# Patient Record
Sex: Male | Born: 1955
Health system: Southern US, Community
[De-identification: ages and names within clinical notes are randomized; demographics above are authoritative.]

## PROBLEM LIST (undated history)

## (undated) DIAGNOSIS — Z789 Other specified health status: Secondary | ICD-10-CM

## (undated) DIAGNOSIS — M1990 Unspecified osteoarthritis, unspecified site: Secondary | ICD-10-CM

## (undated) DIAGNOSIS — Z973 Presence of spectacles and contact lenses: Secondary | ICD-10-CM

## (undated) DIAGNOSIS — E785 Hyperlipidemia, unspecified: Secondary | ICD-10-CM

## (undated) HISTORY — PX: KNEE ARTHROSCOPY: SUR90

## (undated) HISTORY — PX: COLONOSCOPY: SHX174

## (undated) HISTORY — PX: SHOULDER ARTHROSCOPY: SHX128

---

## 1999-07-18 ENCOUNTER — Emergency Department (HOSPITAL_COMMUNITY): Admission: EM | Admit: 1999-07-18 | Discharge: 1999-07-18 | Payer: Self-pay | Admitting: Emergency Medicine

## 2009-08-03 ENCOUNTER — Encounter: Admission: RE | Admit: 2009-08-03 | Discharge: 2009-08-03 | Payer: Self-pay | Admitting: Family Medicine

## 2010-11-30 HISTORY — PX: KNEE ARTHROSCOPY: SHX127

## 2013-03-09 NOTE — H&P (Signed)
Michael Campbell, MD   Jacqualine Code, PA-C [address] [place], [place]  [address]                             [phone]   ORTHOPAEDIC HISTORY & PHYSICAL  YATES WEISGERBER MRN:  [id number] DOB/SEX:  [birth year]/male  CHIEF COMPLAINT:  Painful right shoulder  HISTORY: Michael [name] is seen back today for review of the MRI scan of his right shoulder.  The history is that in February he was tossing some lumber up onto a roof, and after repetitive activity he had experienced pain in his right shoulder.  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  He did put the log down after carrying it a distance.  He used anti-inflammatories but really did not have much help.  He had referable pain down to the proximal humerus, more laterally than medially.  He noticed weakness also.  He also was noted back in 2011 that he had a grade III AC separation, seen by Dr. Renae Fickle.   PAST MEDICAL HISTORY: There are no active problems to display for this patient.  Past Medical History  Diagnosis Date  . Arthritis   . Wears glasses   . Medical history non-contributory   . Hyperlipemia    Past Surgical History  Procedure Laterality Date  . Knee arthroscopy  9/12    left  . Colonoscopy       MEDICATIONS:   Prescriptions prior to admission  Medication Sig Dispense Refill  . ibuprofen (ADVIL,MOTRIN) 200 MG tablet Take 200 mg by mouth every 6 (six) hours as needed.      . Multiple Vitamins-Minerals (MULTIVITAMIN WITH MINERALS) tablet Take 1 tablet by mouth daily.      . pravastatin (PRAVACHOL) 40 MG tablet Take 40 mg by mouth every evening.      . traMADol (ULTRAM) 50 MG tablet Take by mouth every 6 (six) hours as needed.        ALLERGIES:  No Known  Allergies  REVIEW OF SYSTEMS:  A comprehensive review of systems was negative.   FAMILY HISTORY:  History reviewed. No pertinent family history.  SOCIAL HISTORY:   History  Substance Use Topics  . Smoking status: Never Smoker   . Smokeless tobacco: Not on file  . Alcohol Use: Yes     Comment: occ      EXAMINATION: Vital signs in last 24 hours: Temp:  [97.6 F (36.4 C)] 97.6 F (36.4 C) (01/06 1146) Pulse Rate:  [67-86] 85 (01/06 1300) Resp:  [12-20] 19 (01/06 1300) BP: (120-132)/(57-81) 120/57 mmHg (01/06 1250) SpO2:  [96 %-100 %] 100 % (01/06 1300) Weight:  [97.251 kg (214 lb 6.4 oz)] 97.251 kg (214 lb 6.4 oz) (01/06 1146)  Head is normocephalic.   Eyes:  Pupils equal, round and reactive to light and accommodation.  Extraocular intact. ENT: Ears, nose, and throat were benign.   Neck: supple, no bruits were noted.   Chest: good expansion.   Lungs: essentially clear.   Cardiac: regular rhythm and rate, normal S1, S2.  No murmurs appreciated. Pulses :  2+ bilateral and symmetric in lower extremities. Abdomen is scaphoid, soft, nontender, no  masses palpable, normal bowel sounds present. CNS:  He is oriented x3 and cranial nerves II-XII grossly intact. Breast, rectal, and genital exams: not performed and not indicated for an orthopedic evaluation. Musculoskeletal: Today he has a shoulder which reveals 160 degrees of abduction and forward flexion.  With the arm at 90 degrees, he has external rotation to about 70 degrees, internal rotation to 45 degrees.  He is weak in abduction as well as external rotation.  Markedly positive empty can test.  Neurovascularly intact distally  Imaging Review MRI scan revealed some motion on the scan.  There is moderate infraspinatus tendinopathy noted within thinning of the distal infraspinatus tendon, likely from a partial-thickness bursal-surface tearing.  This is present to a lesser extent at the supraspinatus tendon.  Subscapularis and teres  minor tendons are intact.  He has intact musculature.  Biceps is intact.  AC joint shows some mild degenerative changes, a small amount of fluid within the [facility] joint.  Anterolaterally a downsloping acromion predisposed to impingement.  Subacromial morphology is type II, and there is subacromial land subdeltoid bursa fluid.  Glenohumeral joint was intact, labrum was intact, and the bones were intact.   ASSESSMENT: Impingement syndrome of the right shoulder.  Partial-thickness tear of infraspinatus and to a lesser extent the supraspinatus of the right shoulder Past Medical History  Diagnosis Date  . Arthritis   . Wears glasses   . Medical history non-contributory   . Hyperlipemia     PLAN: Plan for right shoulder subacromial decompression with distal clavicle excision and possible mini open rotator cuff repair  The procedure,  risks, and benefits of total knee arthroplasty were presented and reviewed. The risks including but not limited to infection, blood clots, vascular and nerve injury, stiffness,  among others were discussed. The patient acknowledged the explanation, agreed to proceed.   [name] 04/05/2013, 1:06 PM

## 2013-03-30 ENCOUNTER — Encounter (HOSPITAL_BASED_OUTPATIENT_CLINIC_OR_DEPARTMENT_OTHER): Payer: Self-pay | Admitting: *Deleted

## 2013-03-30 NOTE — Progress Notes (Signed)
No labs needed-never smoked-healthy-to go ahead and bring meds and overnight bag in case he has to do open repair

## 2013-04-05 ENCOUNTER — Ambulatory Visit (HOSPITAL_BASED_OUTPATIENT_CLINIC_OR_DEPARTMENT_OTHER)
Admission: RE | Admit: 2013-04-05 | Discharge: 2013-04-05 | Disposition: A | Discharge status: Home / Self Care | Payer: 59 | Source: Ambulatory Visit | Attending: Orthopaedic Surgery | Admitting: Orthopaedic Surgery

## 2013-04-05 ENCOUNTER — Ambulatory Visit (HOSPITAL_BASED_OUTPATIENT_CLINIC_OR_DEPARTMENT_OTHER): Payer: 59 | Admitting: Anesthesiology

## 2013-04-05 ENCOUNTER — Encounter (HOSPITAL_BASED_OUTPATIENT_CLINIC_OR_DEPARTMENT_OTHER): Payer: 59 | Admitting: Anesthesiology

## 2013-04-05 ENCOUNTER — Encounter (HOSPITAL_BASED_OUTPATIENT_CLINIC_OR_DEPARTMENT_OTHER)
Admission: RE | Disposition: A | Discharge status: Home / Self Care | Payer: Self-pay | Source: Ambulatory Visit | Attending: Orthopaedic Surgery

## 2013-04-05 ENCOUNTER — Encounter (HOSPITAL_BASED_OUTPATIENT_CLINIC_OR_DEPARTMENT_OTHER): Payer: Self-pay

## 2013-04-05 DIAGNOSIS — E785 Hyperlipidemia, unspecified: Secondary | ICD-10-CM | POA: Insufficient documentation

## 2013-04-05 DIAGNOSIS — S46819A Strain of other muscles, fascia and tendons at shoulder and upper arm level, unspecified arm, initial encounter: Secondary | ICD-10-CM | POA: Insufficient documentation

## 2013-04-05 DIAGNOSIS — M129 Arthropathy, unspecified: Secondary | ICD-10-CM | POA: Insufficient documentation

## 2013-04-05 DIAGNOSIS — M9420 Chondromalacia, unspecified site: Secondary | ICD-10-CM | POA: Insufficient documentation

## 2013-04-05 DIAGNOSIS — S46919A Strain of unspecified muscle, fascia and tendon at shoulder and upper arm level, unspecified arm, initial encounter: Secondary | ICD-10-CM | POA: Insufficient documentation

## 2013-04-05 DIAGNOSIS — M25819 Other specified joint disorders, unspecified shoulder: Secondary | ICD-10-CM | POA: Insufficient documentation

## 2013-04-05 DIAGNOSIS — M7541 Impingement syndrome of right shoulder: Secondary | ICD-10-CM | POA: Diagnosis present

## 2013-04-05 DIAGNOSIS — M19019 Primary osteoarthritis, unspecified shoulder: Secondary | ICD-10-CM | POA: Diagnosis present

## 2013-04-05 DIAGNOSIS — M7580 Other shoulder lesions, unspecified shoulder: Secondary | ICD-10-CM

## 2013-04-05 DIAGNOSIS — X500XXA Overexertion from strenuous movement or load, initial encounter: Secondary | ICD-10-CM | POA: Insufficient documentation

## 2013-04-05 HISTORY — PX: SHOULDER ARTHROSCOPY WITH OPEN ROTATOR CUFF REPAIR AND DISTAL CLAVICLE ACROMINECTOMY: SHX5683

## 2013-04-05 HISTORY — DX: Unspecified osteoarthritis, unspecified site: M19.90

## 2013-04-05 HISTORY — DX: Other specified health status: Z78.9

## 2013-04-05 HISTORY — DX: Hyperlipidemia, unspecified: E78.5

## 2013-04-05 HISTORY — DX: Presence of spectacles and contact lenses: Z97.3

## 2013-04-05 LAB — POCT HEMOGLOBIN-HEMACUE: Hemoglobin: 13.9 g/dL (ref 13.0–17.0)

## 2013-04-05 SURGERY — SHOULDER ARTHROSCOPY WITH OPEN ROTATOR CUFF REPAIR AND DISTAL CLAVICLE ACROMINECTOMY
Anesthesia: General | Site: Shoulder | Laterality: Right

## 2013-04-05 MED ORDER — PROMETHAZINE HCL 25 MG/ML IJ SOLN: 6.2500 mg | INTRAMUSCULAR | Status: DC | PRN

## 2013-04-05 MED ORDER — PROPOFOL 10 MG/ML IV BOLUS
INTRAVENOUS | Status: DC | PRN
  Administered 2013-04-05: 200 mg via INTRAVENOUS

## 2013-04-05 MED ORDER — DEXAMETHASONE SODIUM PHOSPHATE 4 MG/ML IJ SOLN
INTRAMUSCULAR | Status: DC | PRN
  Administered 2013-04-05: 10 mg via INTRAVENOUS

## 2013-04-05 MED ORDER — FENTANYL CITRATE 0.05 MG/ML IJ SOLN
INTRAMUSCULAR | Status: AC
  Filled 2013-04-05: qty 2

## 2013-04-05 MED ORDER — OXYCODONE-ACETAMINOPHEN 5-325 MG PO TABS: 12.0000 | ORAL_TABLET | ORAL | Status: DC | PRN

## 2013-04-05 MED ORDER — BUPIVACAINE-EPINEPHRINE PF 0.5-1:200000 % IJ SOLN
INTRAMUSCULAR | Status: DC | PRN
  Administered 2013-04-05: 30 mL via PERINEURAL

## 2013-04-05 MED ORDER — BUPIVACAINE-EPINEPHRINE PF 0.25-1:200000 % IJ SOLN
INTRAMUSCULAR | Status: AC
  Filled 2013-04-05: qty 30

## 2013-04-05 MED ORDER — CHLORHEXIDINE GLUCONATE 4 % EX LIQD: 60.0000 mL | Freq: Once | CUTANEOUS | Status: DC

## 2013-04-05 MED ORDER — ONDANSETRON HCL 4 MG/2ML IJ SOLN
INTRAMUSCULAR | Status: DC | PRN
  Administered 2013-04-05: 4 mg via INTRAVENOUS

## 2013-04-05 MED ORDER — SUCCINYLCHOLINE CHLORIDE 20 MG/ML IJ SOLN
INTRAMUSCULAR | Status: DC | PRN
  Administered 2013-04-05: 100 mg via INTRAVENOUS

## 2013-04-05 MED ORDER — MIDAZOLAM HCL 2 MG/2ML IJ SOLN
1.0000 mg | INTRAMUSCULAR | Status: DC | PRN
  Administered 2013-04-05: 2 mg via INTRAVENOUS

## 2013-04-05 MED ORDER — LIDOCAINE HCL (CARDIAC) 20 MG/ML IV SOLN
INTRAVENOUS | Status: DC | PRN
  Administered 2013-04-05: 50 mg via INTRAVENOUS

## 2013-04-05 MED ORDER — FENTANYL CITRATE 0.05 MG/ML IJ SOLN
50.0000 ug | INTRAMUSCULAR | Status: DC | PRN
  Administered 2013-04-05: 100 ug via INTRAVENOUS

## 2013-04-05 MED ORDER — MIDAZOLAM HCL 2 MG/2ML IJ SOLN
INTRAMUSCULAR | Status: AC
  Filled 2013-04-05: qty 2

## 2013-04-05 MED ORDER — LACTATED RINGERS IV SOLN
INTRAVENOUS | Status: DC
  Administered 2013-04-05 (×2): via INTRAVENOUS

## 2013-04-05 MED ORDER — OXYCODONE-ACETAMINOPHEN 5-325 MG PO TABS: 1.0000 | ORAL_TABLET | ORAL | Status: DC | PRN

## 2013-04-05 MED ORDER — CEFAZOLIN SODIUM-DEXTROSE 2-3 GM-% IV SOLR
INTRAVENOUS | Status: DC | PRN
  Administered 2013-04-05: 2 g via INTRAVENOUS

## 2013-04-05 MED ORDER — SODIUM CHLORIDE 0.9 % IR SOLN
Status: DC | PRN
  Administered 2013-04-05: 13000 mL

## 2013-04-05 MED ORDER — FENTANYL CITRATE 0.05 MG/ML IJ SOLN
INTRAMUSCULAR | Status: DC | PRN
  Administered 2013-04-05 (×2): 25 ug via INTRAVENOUS

## 2013-04-05 MED ORDER — OXYCODONE HCL 5 MG/5ML PO SOLN: 5.0000 mg | Freq: Once | ORAL | Status: DC | PRN

## 2013-04-05 MED ORDER — SODIUM CHLORIDE 0.9 % IV SOLN: INTRAVENOUS | Status: DC

## 2013-04-05 MED ORDER — FENTANYL CITRATE 0.05 MG/ML IJ SOLN
INTRAMUSCULAR | Status: AC
  Filled 2013-04-05: qty 6

## 2013-04-05 MED ORDER — HYDROMORPHONE HCL PF 1 MG/ML IJ SOLN: 0.2500 mg | INTRAMUSCULAR | Status: DC | PRN

## 2013-04-05 MED ORDER — OXYCODONE HCL 5 MG PO TABS: 5.0000 mg | ORAL_TABLET | Freq: Once | ORAL | Status: DC | PRN

## 2013-04-05 SURGICAL SUPPLY — 76 items
APL SKNCLS STERI-STRIP NONHPOA (GAUZE/BANDAGES/DRESSINGS)
BAG DECANTER FOR FLEXI CONT (MISCELLANEOUS) IMPLANT
BENZOIN TINCTURE PRP APPL 2/3 (GAUZE/BANDAGES/DRESSINGS) IMPLANT
BLADE 4.2CUDA (BLADE) IMPLANT
BLADE AVERAGE 25MMX9MM (BLADE)
BLADE AVERAGE 25X9 (BLADE) IMPLANT
BLADE CUDA 5.5 (BLADE) IMPLANT
BLADE GREAT WHITE 4.2 (BLADE) IMPLANT
BLADE GREAT WHITE 4.2MM (BLADE)
BLADE SURG 15 STRL LF DISP TIS (BLADE) IMPLANT
BLADE SURG 15 STRL SS (BLADE)
BUR OVAL 6.0 (BURR) ×3 IMPLANT
CANISTER SUCT 3000ML (MISCELLANEOUS) IMPLANT
CANISTER SUCT LVC 12 LTR MEDI- (MISCELLANEOUS) IMPLANT
CANNULA 5.75X71 LONG (CANNULA) IMPLANT
CANNULA ACUFLEX KIT 5X76 (CANNULA) ×3 IMPLANT
CANNULA TWIST IN 8.25X7CM (CANNULA) IMPLANT
CLEANER CAUTERY TIP 5X5 PAD (MISCELLANEOUS) IMPLANT
CLOSURE WOUND 1/2 X4 (GAUZE/BANDAGES/DRESSINGS)
CUTTER MENISCUS  4.2MM (BLADE) ×2
CUTTER MENISCUS 4.2MM (BLADE) ×1 IMPLANT
DECANTER SPIKE VIAL GLASS SM (MISCELLANEOUS) IMPLANT
DRAPE SHOULDER BEACH CHAIR (DRAPES) ×3 IMPLANT
DRAPE SURG 17X23 STRL (DRAPES) ×3 IMPLANT
DRAPE U-SHAPE 47X51 STRL (DRAPES) ×3 IMPLANT
DRSG EMULSION OIL 3X3 NADH (GAUZE/BANDAGES/DRESSINGS) ×4 IMPLANT
DURAPREP 26ML APPLICATOR (WOUND CARE) ×3 IMPLANT
ELECT NEEDLE TIP 2.8 STRL (NEEDLE) IMPLANT
ELECT REM PT RETURN 9FT ADLT (ELECTROSURGICAL) ×3
ELECTRODE REM PT RTRN 9FT ADLT (ELECTROSURGICAL) ×1 IMPLANT
GAUZE SPONGE 4X4 16PLY XRAY LF (GAUZE/BANDAGES/DRESSINGS) IMPLANT
GLOVE BIO SURGEON STRL SZ8.5 (GLOVE) ×2 IMPLANT
GLOVE BIOGEL PI IND STRL 7.0 (GLOVE) ×2 IMPLANT
GLOVE BIOGEL PI IND STRL 8 (GLOVE) ×1 IMPLANT
GLOVE BIOGEL PI INDICATOR 7.0 (GLOVE) ×4
GLOVE BIOGEL PI INDICATOR 8 (GLOVE) ×2
GLOVE ECLIPSE 6.5 STRL STRAW (GLOVE) ×2 IMPLANT
GLOVE ECLIPSE 8.0 STRL XLNG CF (GLOVE) ×6 IMPLANT
GOWN STRL REUS W/ TWL LRG LVL3 (GOWN DISPOSABLE) ×1 IMPLANT
GOWN STRL REUS W/ TWL XL LVL3 (GOWN DISPOSABLE) IMPLANT
GOWN STRL REUS W/TWL 2XL LVL3 (GOWN DISPOSABLE) ×2 IMPLANT
GOWN STRL REUS W/TWL LRG LVL3 (GOWN DISPOSABLE) ×3
GOWN STRL REUS W/TWL XL LVL3 (GOWN DISPOSABLE) ×3
NEEDLE 1/2 CIR CATGUT .05X1.09 (NEEDLE) IMPLANT
NEEDLE SCORPION MULTI FIRE (NEEDLE) IMPLANT
NS IRRIG 1000ML POUR BTL (IV SOLUTION) IMPLANT
PACK ARTHROSCOPY DSU (CUSTOM PROCEDURE TRAY) ×3 IMPLANT
PACK BASIN DAY SURGERY FS (CUSTOM PROCEDURE TRAY) ×3 IMPLANT
PAD ABD 8X10 STRL (GAUZE/BANDAGES/DRESSINGS) ×6 IMPLANT
PAD CLEANER CAUTERY TIP 5X5 (MISCELLANEOUS)
PENCIL BUTTON HOLSTER BLD 10FT (ELECTRODE) IMPLANT
SET ARTHROSCOPY TUBING (MISCELLANEOUS) ×3
SET ARTHROSCOPY TUBING LN (MISCELLANEOUS) ×1 IMPLANT
SLEEVE SCD COMPRESS KNEE MED (MISCELLANEOUS) ×3 IMPLANT
SLING ARM FOAM STRAP LRG (SOFTGOODS) IMPLANT
SLING ARM FOAM STRAP MED (SOFTGOODS) IMPLANT
SLING ARM FOAM STRAP XLG (SOFTGOODS) ×2 IMPLANT
SPONGE GAUZE 4X4 12PLY (GAUZE/BANDAGES/DRESSINGS) ×3 IMPLANT
SPONGE LAP 4X18 X RAY DECT (DISPOSABLE) IMPLANT
STAPLER VISISTAT 35W (STAPLE) IMPLANT
STRIP CLOSURE SKIN 1/2X4 (GAUZE/BANDAGES/DRESSINGS) IMPLANT
SUCTION FRAZIER TIP 10 FR DISP (SUCTIONS) IMPLANT
SUT BONE WAX W31G (SUTURE) IMPLANT
SUT ETHILON 3 0 PS 1 (SUTURE) IMPLANT
SUT ETHILON 4 0 PS 2 18 (SUTURE) IMPLANT
SUT PROLENE 3 0 PS 2 (SUTURE) IMPLANT
SUT VIC AB 0 CT1 27 (SUTURE)
SUT VIC AB 0 CT1 27XBRD ANBCTR (SUTURE) IMPLANT
SUT VIC AB 2-0 PS2 27 (SUTURE) IMPLANT
SUT VIC AB 2-0 SH 27 (SUTURE)
SUT VIC AB 2-0 SH 27XBRD (SUTURE) IMPLANT
SYR BULB 3OZ (MISCELLANEOUS) IMPLANT
TOWEL OR 17X24 6PK STRL BLUE (TOWEL DISPOSABLE) ×3 IMPLANT
WAND STAR VAC 90 (SURGICAL WAND) ×6 IMPLANT
WATER STERILE IRR 1000ML POUR (IV SOLUTION) ×3 IMPLANT
YANKAUER SUCT BULB TIP NO VENT (SUCTIONS) IMPLANT

## 2013-04-05 NOTE — Op Note (Signed)
PATIENT ID:      Michael [name]  MRN:     [id number] DOB/AGE:    [birth year] / 58 y.o.       OPERATIVE REPORT    DATE OF PROCEDURE:  04/05/2013       PREOPERATIVE DIAGNOSIS:   IMPINGEMENT SYNDROME RIGHT SHOULDER; AC OA; PARTIAL ROTATOR CUFF TEAR                                                       Estimated body mass index is 29.92 kg/(m^2) as calculated from the following:   Height as of this encounter: 5\' 11"  (1.803 m).   Weight as of this encounter: 97.251 kg (214 lb 6.4 oz).     POSTOPERATIVE DIAGNOSIS:   IMPINGEMENT SYNDROME RIGHT SHOULDER; Acromial clavicular osteoarthritis , tearing of anterior glenoid labrum                                                                    Estimated body mass index is 29.92 kg/(m^2) as calculated from the following:   Height as of this encounter: 5\' 11"  (1.803 m).   Weight as of this encounter: 97.251 kg (214 lb 6.4 oz).     PROCEDURE:  Procedure(s): SHOULDER ARTHROSCOPIC DEBRIDEMENT OF TORN GLENOID LABRUM, SAD,DCR     SURGEON:  Joni Fears, MD    ASSISTANT:   Biagio Borg, PA-C   (Present and scrubbed throughout the case, critical for assistance with exposure, retraction, instrumentation, and closure.)          ANESTHESIA: regional and general     DRAINS: none :      TOURNIQUET TIME: * No tourniquets in log *    COMPLICATIONS:  None   CONDITION:  stable  PROCEDURE IN DETAIL: [id number]   [name], Michael W 04/05/2013, 2:34 PM

## 2013-04-05 NOTE — Anesthesia Procedure Notes (Addendum)
Anesthesia Regional Block:  Interscalene brachial plexus block  Pre-Anesthetic Checklist: ,, timeout performed, Correct Patient, Correct Site, Correct Laterality, Correct Procedure, Correct Position, site marked, Risks and benefits discussed, at surgeon's request and post-op pain management  Laterality: Upper and Right  Prep: chloraprep       Needles:  Injection technique: Single-shot  Needle Type: Echogenic Needle      Needle Gauge: 22 and 22 G  Needle insertion depth: 4 cm   Additional Needles:  Procedures: ultrasound guided (picture in chart) and nerve stimulator Interscalene brachial plexus block  Nerve Stimulator or Paresthesia:  Response: Twitch elicited, 0.5 mA, 0.3 ms,   Additional Responses:   Narrative:  Start time: 04/05/2013 12:35 PM End time: 04/05/2013 12:55 PM Injection made incrementally with aspirations every 5 mL.  Performed by: Personally  Anesthesiologist: Bartolo Darter, MD  Additional Notes: Block assessed prior to start of surgery   Procedure Name: Intubation Date/Time: 04/05/2013 1:34 PM Performed by: Melynda Ripple D Pre-anesthesia Checklist: Patient identified, Emergency Drugs available, Suction available and Patient being monitored Patient Re-evaluated:Patient Re-evaluated prior to inductionOxygen Delivery Method: Circle System Utilized Preoxygenation: Pre-oxygenation with 100% oxygen Intubation Type: IV induction Ventilation: Mask ventilation without difficulty Laryngoscope Size: Mac and 3 Grade View: Grade I Tube type: Oral Number of attempts: 1 Airway Equipment and Method: stylet and oral airway Placement Confirmation: ETT inserted through vocal cords under direct vision,  positive ETCO2 and breath sounds checked- equal and bilateral Secured at: 23 cm Tube secured with: Tape Dental Injury: Teeth and Oropharynx as per pre-operative assessment

## 2013-04-05 NOTE — Progress Notes (Signed)
Assisted Dr. Massagee with right, ultrasound guided, interscalene  block. Side rails up, monitors on throughout procedure. See vital signs in flow sheet. Tolerated Procedure well. 

## 2013-04-05 NOTE — Discharge Instructions (Signed)
Discharge Instructions After Orthopedic Procedures: ° °*You may feel tired and weak following your procedure. It is recommended that you limit physical activity for the next 24 hours and rest at home for the remainder of today and tomorrow. °*No strenuous activity should be started without your doctor's permission. ° °Elevate the extremity that you had surgery on to a level above your heart. This should continue for 48 hours or as instructed by your doctor. ° °If you had hand, arm or shoulder surgery you should move your fingers frequently unless otherwise instructed by your doctor. ° °If you had foot, knee or leg surgery you should wiggle your toes frequently unless otherwise instructed by your doctor. ° °Follow your doctor's exact instructions for activity at home. Use your home equipment as instructed. (Crutches, hard shoes, slings etc.) ° °Limit your activity as instructed by your doctor. ° °Report to your doctor should any of the following occur: °1. Extreme swelling of your fingers or toes. °2. Inability to wiggle your fingers or toes. °3. Coldness, pale or bluish color in your fingers or toes. °4. Loss of sensation, numbness or tingling of your fingers or toes. °5. Unusual smell or odor from under your dressing or cast. °6. Excessive bleeding or drainage from the surgical site. °7. Pain not relieved by medication your doctor has prescribed for you. °8. Cast or dressing too tight (do not get your dressing or cast wet or put anything under          your dressing or cast.) ° °*Do not change your dressing unless instructed by your doctor or discharge nurse. Then follow exact instructions. ° °*Follow labeled instructions for any medications that your doctor may have prescribed for you. °*Should any questions or complications develop following your procedure, PLEASE CONTACT YOUR DOCTOR. ° ° °Regional Anesthesia Blocks ° °1. Numbness or the inability to move the "blocked" extremity may last from 3-48 hours after  placement. The length of time depends on the medication injected and your individual response to the medication. If the numbness is not going away after 48 hours, call your surgeon. ° °2. The extremity that is blocked will need to be protected until the numbness is gone and the  Strength has returned. Because you cannot feel it, you will need to take extra care to avoid injury. Because it may be weak, you may have difficulty moving it or using it. You may not know what position it is in without looking at it while the block is in effect. ° °3. For blocks in the legs and feet, returning to weight bearing and walking needs to be done carefully. You will need to wait until the numbness is entirely gone and the strength has returned. You should be able to move your leg and foot normally before you try and bear weight or walk. You will need someone to be with you when you first try to ensure you do not fall and possibly risk injury. ° °4. Bruising and tenderness at the needle site are common side effects and will resolve in a few days. ° °5. Persistent numbness or new problems with movement should be communicated to the surgeon or the [facility] ([phone])/ [facility] ([phone]). ° ° °Post Anesthesia Home Care Instructions ° °Activity: °Get plenty of rest for the remainder of the day. A responsible adult should stay with you for 24 hours following the procedure.  °For the next 24 hours, DO NOT: °-Drive a car °-Operate machinery °-  Drink alcoholic beverages °-Take any medication unless instructed by your physician °-Make any legal decisions or sign important papers. ° °Meals: °Start with liquid foods such as gelatin or soup. Progress to regular foods as tolerated. Avoid greasy, spicy, heavy foods. If nausea and/or vomiting occur, drink only clear liquids until the nausea and/or vomiting subsides. Call your physician if vomiting continues. ° °Special Instructions/Symptoms: °Your throat may  feel dry or sore from the anesthesia or the breathing tube placed in your throat during surgery. If this causes discomfort, gargle with warm salt water. The discomfort should disappear within 24 hours. ° °

## 2013-04-05 NOTE — Anesthesia Postprocedure Evaluation (Signed)
  Anesthesia Post-op Note  Patient: Roxanne Mins  Procedure(s) Performed: Procedure(s) with comments: SHOULDER ARTHROSCOPIC DEBRIDEMENT, ACROMIOPLASTY, DISTAL CLAVICLE RESECTION, POSSIBLE MINI OPEN ROTATOR CUFF REPAIR.   (Right) - ARTHROSCOPIC DEBRIDEMENT SHOULDER, ACORMIOPLASTY, DISTAL CLAVICLE RESECTION, POSSIBLE MINI OPEN ROTATOR CUFF REPAIR.    Patient Location: PACU  Anesthesia Type:General and GA combined with regional for post-op pain  Level of Consciousness: awake and alert   Airway and Oxygen Therapy: Patient Spontanous Breathing  Post-op Pain: mild  Post-op Assessment: Post-op Vital signs reviewed, Patient's Cardiovascular Status Stable, Respiratory Function Stable, Patent Airway, No signs of Nausea or vomiting and Pain level controlled  Post-op Vital Signs: Reviewed and stable  Complications: No apparent anesthesia complications

## 2013-04-05 NOTE — Anesthesia Preprocedure Evaluation (Signed)
Anesthesia Evaluation  Patient identified by MRN, date of birth, ID band Patient awake    History of Anesthesia Complications Negative for: history of anesthetic complications  Airway Mallampati: II  Neck ROM: Full    Dental  (+) Teeth Intact   Pulmonary neg pulmonary ROS,  breath sounds clear to auscultation        Cardiovascular negative cardio ROS  Rhythm:Regular Rate:Normal     Neuro/Psych    GI/Hepatic negative GI ROS, Neg liver ROS,   Endo/Other  negative endocrine ROS  Renal/GU negative Renal ROS     Musculoskeletal   Abdominal   Peds  Hematology   Anesthesia Other Findings   Reproductive/Obstetrics                           Anesthesia Physical Anesthesia Plan  ASA: I  Anesthesia Plan: General   Post-op Pain Management:    Induction: Intravenous  Airway Management Planned: Oral ETT  Additional Equipment:   Intra-op Plan:   Post-operative Plan:   Informed Consent: I have reviewed the patients History and Physical, chart, labs and discussed the procedure including the risks, benefits and alternatives for the proposed anesthesia with the patient or authorized representative who has indicated his/her understanding and acceptance.   Dental advisory given  Plan Discussed with: CRNA and Surgeon  Anesthesia Plan Comments:         Anesthesia Quick Evaluation

## 2013-04-05 NOTE — Transfer of Care (Signed)
Immediate Anesthesia Transfer of Care Note  Patient: Michael [name]  Procedure(s) Performed: Procedure(s) with comments: SHOULDER ARTHROSCOPIC DEBRIDEMENT, ACROMIOPLASTY, DISTAL CLAVICLE RESECTION, POSSIBLE MINI OPEN ROTATOR CUFF REPAIR.   (Right) - ARTHROSCOPIC DEBRIDEMENT SHOULDER, ACORMIOPLASTY, DISTAL CLAVICLE RESECTION, POSSIBLE MINI OPEN ROTATOR CUFF REPAIR.    Patient Location: PACU  Anesthesia Type:GA combined with regional for post-op pain  Level of Consciousness: awake, sedated and patient cooperative  Airway & Oxygen Therapy: Patient Spontanous Breathing and Patient connected to face mask oxygen  Post-op Assessment: Report given to PACU RN and Post -op Vital signs reviewed and stable  Post vital signs: Reviewed and stable  Complications: No apparent anesthesia complications

## 2013-04-05 NOTE — H&P (Signed)
  The recent History & Physical has been reviewed. I have personally examined the patient today. There is no interval change to the documented History & Physical. The patient would like to proceed with the procedure.  Michael [name] W 04/05/2013,  1:17 PM

## 2013-04-06 ENCOUNTER — Encounter (HOSPITAL_BASED_OUTPATIENT_CLINIC_OR_DEPARTMENT_OTHER): Payer: Self-pay | Admitting: Orthopaedic Surgery

## 2013-04-06 NOTE — Op Note (Signed)
Michael [name], Michael [name]                   ACCOUNT NO.:  [id number]  MEDICAL RECORD NO.:  [id number]  LOCATION:                               FACILITY:  [facility]  PHYSICIAN:  Vonna Kotyk. Sherra Kimmons, M.D.DATE OF BIRTH:  1955-06-12  DATE OF PROCEDURE:  04/05/2013 DATE OF DISCHARGE:  04/05/2013                              OPERATIVE REPORT   PREOPERATIVE DIAGNOSES: 1. Partial rotator cuff tear, right shoulder with impingement. 2. Osteoarthritis acromioclavicular joint.  POSTOPERATIVE DIAGNOSES: 1. Partial rotator cuff tear, right shoulder with impingement. 2. Osteoarthritis acromioclavicular joint. 3. Torn anterior glenoid labrum.  PROCEDURE: 1. Diagnostic arthroscopy, right shoulder with debridement of torn     anterior glenoid labrum. 2. Arthroscopic subacromial decompression. 3. Arthroscopic distal clavicle resection.  SURGEON:  Vonna Kotyk. Durward Fortes, MD  ASSISTANT:  Mike Craze. Petrarca, PA-C.  ANESTHESIA:  General with supplemental interscalene nerve block.  COMPLICATIONS:  None.  HISTORY:  A 58 year old gentleman who has had a lifting injury to his left upper extremity in the recent past, and over time does not respond to cortisone injection and exercise as he has had an MRI scan that reveals some bursal surface tearing of the infra and supraspinatus associated with a type 2 acromion and AC joint arthritis.  He has had a prior AC joint injury approximately 4 years ago, and has done well from that standpoint.  He has a positive impingement test and positive empty can, and now wishes to proceed with arthroscopic decompression.  DESCRIPTION OF PROCEDURE:  Mr. Yankowski was met with his wife in the holding area, identified the right shoulder as appropriate operative site, marked it accordingly.  The patient was then transported to room #5 and placed under general anesthesia.  Did receive a preoperative interscalene nerve block per anesthesia.  The right shoulder was then prepped from the base of  the neck circumferentially below the elbow with DuraPrep.  Sterile draping was performed while the patient was in the semi-sitting position. Examination of the shoulder revealed no evidence of instability.  A time- out was called.  Marking pen was then used to outline the [facility] joint, the coracoid and the acromion.  At a point of fingerbreadth posterior and medial to this posterior angle acromion, a small stab wound was made.  The arthroscope easily placed in the shoulder joint.  Diagnostic arthroscopy revealed an intact biceps tendon.  It was a very small partial rotator cuff tear involving infra and supraspinatus.  The subscapularis was intact.  There was minimal chondromalacia of the humeral head and acromion.  Of the humeral head, there was some grade 2 chondromalacia of the glenoid along the lower half associated with fraying and tearing of the glenoid labrum from 1 to about the 7 o'clock position.  The second portal was established anteriorly in the shoulder and then I debrided the labrum with the ArthroCare wand and a Cuda shaver.  Any bleeding was controlled with the ArthroCare wand.  There were no loose bodies.  The arthroscope was then placed in several subacromial space anteriorly and a third portal established in the lateral subacromial space.  An arthroscopic subacromial decompression was performed.  There was impingement both  of the anterior and the lateral acromion, 6 mm bur was used to perform the acromioplasty with a nice flat resection.  CA ligament was released at the acromion with the ArthroCare wand.  There was considerable scarring about the [facility] joint from a prior injury. The soft tissue was debrided, so I could visualize the distal clavicle that had little if any cartilage on it.  The distal clavicle resection was performed in the same 6 mm bur with a nice flat resection.  The distal clavicle did not appear to be high riding.  At that point, the space was  re-evaluated and was clear of any soft tissue.  The 3 portals were then irrigated with saline solution.  The anterolateral portals closed with interrupted 4-0 Ethilon.  Sterile bulky dressing was applied followed by a sling.  PLAN:  Oxycodone for pain, office in 1 week.     Vonna Kotyk. Durward Fortes, M.D.     PWW/MEDQ  D:  04/05/2013  T:  04/06/2013  Job:  [id number]

## 2013-09-18 ENCOUNTER — Emergency Department (HOSPITAL_COMMUNITY)
Admission: EM | Admit: 2013-09-18 | Discharge: 2013-09-19 | Disposition: A | Discharge status: Home / Self Care | Payer: 59 | Attending: Emergency Medicine | Admitting: Emergency Medicine

## 2013-09-18 ENCOUNTER — Encounter (HOSPITAL_COMMUNITY): Payer: Self-pay | Admitting: Emergency Medicine

## 2013-09-18 DIAGNOSIS — Y929 Unspecified place or not applicable: Secondary | ICD-10-CM | POA: Insufficient documentation

## 2013-09-18 DIAGNOSIS — Z9119 Patient's noncompliance with other medical treatment and regimen: Secondary | ICD-10-CM | POA: Insufficient documentation

## 2013-09-18 DIAGNOSIS — Z91199 Patient's noncompliance with other medical treatment and regimen due to unspecified reason: Secondary | ICD-10-CM | POA: Insufficient documentation

## 2013-09-18 DIAGNOSIS — Z792 Long term (current) use of antibiotics: Secondary | ICD-10-CM | POA: Insufficient documentation

## 2013-09-18 DIAGNOSIS — W540XXA Bitten by dog, initial encounter: Secondary | ICD-10-CM | POA: Insufficient documentation

## 2013-09-18 DIAGNOSIS — Y9389 Activity, other specified: Secondary | ICD-10-CM | POA: Insufficient documentation

## 2013-09-18 DIAGNOSIS — M129 Arthropathy, unspecified: Secondary | ICD-10-CM | POA: Insufficient documentation

## 2013-09-18 DIAGNOSIS — S61052A Open bite of left thumb without damage to nail, initial encounter: Secondary | ICD-10-CM

## 2013-09-18 DIAGNOSIS — S61209A Unspecified open wound of unspecified finger without damage to nail, initial encounter: Secondary | ICD-10-CM | POA: Insufficient documentation

## 2013-09-18 DIAGNOSIS — E785 Hyperlipidemia, unspecified: Secondary | ICD-10-CM | POA: Insufficient documentation

## 2013-09-18 DIAGNOSIS — Z79899 Other long term (current) drug therapy: Secondary | ICD-10-CM | POA: Insufficient documentation

## 2013-09-18 MED ORDER — AMOXICILLIN-POT CLAVULANATE 875-125 MG PO TABS: 1.0000 | ORAL_TABLET | Freq: Two times a day (BID) | ORAL | Status: DC

## 2013-09-18 NOTE — ED Notes (Addendum)
Pt presents with c/o animal bite. Pt was bit by his personal dog on his left thumb. Pt says that the dog's tooth went all the way through his thumb. Pt has a laceration on one side of the thumb area and a small abrasion-like area on the opposite side where he says the dog's tooth came through the skin. Bleeding controlled at this time. Rabies shots are up to date for the animal.

## 2013-09-18 NOTE — ED Provider Notes (Signed)
CSN: [id number]     Arrival date & time 09/18/13  2144 History  This chart was scribed for non-physician practitioner, Antonietta Breach, PA-C working with Michael Rice, MD by Frederich Balding, ED scribe. This patient was seen in room WTR6/WTR6 and the patient's care was started at 11:30 PM.   Chief Complaint  Patient presents with  . Animal Bite   The history is provided by the patient. No language interpreter was used.   HPI Comments: Michael [name] is a 58 y.o. male who presents to the Emergency Department complaining of a dog bite to his left thumb that occurred prior to arrival. The dog's tooth went through his thumb but pt states that the dog "only bit the flesh part" of his thumb. It is his dog and states it is up to date on its vaccinations. Denies pallor, loss of sensation or weakness in his thumb. Pt's tetanus UTD. He denies the use of blood thinners.  Past Medical History  Diagnosis Date  . Arthritis   . Wears glasses   . Medical history non-contributory   . Hyperlipemia    Past Surgical History  Procedure Laterality Date  . Knee arthroscopy  9/12    left  . Colonoscopy    . Shoulder arthroscopy with open rotator cuff repair and distal clavicle acrominectomy Right 04/05/2013    Procedure: SHOULDER ARTHROSCOPIC DEBRIDEMENT, ACROMIOPLASTY, DISTAL CLAVICLE RESECTION, POSSIBLE MINI OPEN ROTATOR CUFF REPAIR.  ;  Surgeon: Garald Balding, MD;  Location: [place];  Service: Orthopedics;  Laterality: Right;  ARTHROSCOPIC DEBRIDEMENT SHOULDER, ACORMIOPLASTY, DISTAL CLAVICLE RESECTION, POSSIBLE MINI OPEN ROTATOR CUFF REPAIR.     No family history on file. History  Substance Use Topics  . Smoking status: Never Smoker   . Smokeless tobacco: Not on file  . Alcohol Use: Yes     Comment: occ    Review of Systems  Skin: Positive for wound.  Neurological: Negative for weakness and numbness.  All other systems reviewed and are negative.   Allergies  Review of patient's  allergies indicates no known allergies.  Home Medications   Prior to Admission medications   Medication Sig Start Date End Date Taking? Authorizing Provider  amoxicillin-clavulanate (AUGMENTIN) 875-125 MG per tablet Take 1 tablet by mouth every 12 (twelve) hours. 09/18/13   Antonietta Breach, PA-C  ibuprofen (ADVIL,MOTRIN) 200 MG tablet Take 200 mg by mouth every 6 (six) hours as needed.    Historical Provider, MD  Multiple Vitamins-Minerals (MULTIVITAMIN WITH MINERALS) tablet Take 1 tablet by mouth daily.    Historical Provider, MD  oxyCODONE-acetaminophen (ROXICET) 5-325 MG per tablet Take 1-2 tablets by mouth every 4 (four) hours as needed for moderate pain or severe pain. 04/05/13   Biagio Borg, PA-C  pravastatin (PRAVACHOL) 40 MG tablet Take 40 mg by mouth every evening.    Historical Provider, MD   BP 138/82  Pulse 67  Temp(Src) 97.8 F (36.6 C) (Oral)  Resp 18  SpO2 97%  Physical Exam  Nursing note and vitals reviewed. Constitutional: He is oriented to person, place, and time. He appears well-developed and well-nourished. No distress.  HENT:  Head: Normocephalic and atraumatic.  Eyes: Conjunctivae and EOM are normal. No scleral icterus.  Neck: Normal range of motion.  Cardiovascular: Normal rate, regular rhythm and intact distal pulses.   Distal radial pulse 2+ in LUE. Capillary refill normal in L thumb  Pulmonary/Chest: Effort normal. No respiratory distress.  Musculoskeletal: Normal range of motion. He exhibits tenderness.  TTP on pad of L thumb. 5/5 strength against resistance of flexors and extensors of thumb. 2cm jagged laceration noted to fat pad of L thumb. Bleeding controlled.  Neurological: He is alert and oriented to person, place, and time. Coordination normal.  No gross sensory deficits appreciated. Patient able to wiggle all fingers of L hand.  Skin: Skin is warm and dry. No rash noted. He is not diaphoretic. No erythema. No pallor.  2 cm jagged laceration noted to  left thumb.   Psychiatric: He has a normal mood and affect. His behavior is normal.    ED Course  Procedures (including critical care time)  DIAGNOSTIC STUDIES: Oxygen Saturation is 97% on RA, normal by my interpretation.    COORDINATION OF CARE: 11:35 PM-Discussed treatment plan which includes an antibiotic with pt at bedside and pt agreed to plan. Risks of suturing were discussed but he was told it would be a loose suture. Pt states he wants one suture placed since he works in [employer]. Pt decline xray.  LACERATION REPAIR PROCEDURE NOTE The patient's identification was confirmed and consent was obtained. This procedure was performed by Antonietta Breach, PA-C at 11:38 PM. Site: left thumb Sterile procedures observed Anesthetic used (type and amt): none Suture type/size: 5-0 Prolene Length: 2 cm # of Sutures: 1 Technique: simple interrupted Complexity: simple Tetanus UTD Site anesthetized, irrigated with NS, explored without evidence of foreign body, wound well approximated, site covered with dry, sterile dressing.  Patient tolerated procedure well without complications. Instructions for care discussed verbally and patient provided with additional written instructions for homecare and f/u.  Labs Review Labs Reviewed - No data to display  Imaging Review No results found.   EKG Interpretation None      MDM   Final diagnoses:  Animal bite of thumb, left, initial encounter   Patient presents to the emergency department for dog bite to his left thumb. Patient neurovascularly intact. Laceration noted to fat pad of left and one approximately 2 cm in length. Normal ROM of thumb with normal strength against resistance. Tetanus UTD. Xray offered which patient declines. Have also discussed increased infection risk with wound closure; patient opts for 1 tacking suture today. Risks of tacking sutures explained to which patient verbalizes understanding. Patient stable for d/c with  course of Augmentin. Have advised suture removal in 12-14 days. Return precautions discussed in provided. Patient agreeable to plan with no unaddressed concerns.  I personally performed the services described in this documentation, which was scribed in my presence. The recorded information has been reviewed and is accurate.   Filed Vitals:   09/18/13 2201 09/18/13 2359  BP: 138/82 120/72  Pulse: 67 55  Temp: 97.8 F (36.6 C) 98.1 F (36.7 C)  TempSrc: Oral Oral  Resp: 18 18  SpO2: 97% 98%     Antonietta Breach, PA-C 09/19/13 0010

## 2013-09-18 NOTE — Discharge Instructions (Signed)

## 2013-09-19 NOTE — ED Provider Notes (Signed)
Medical screening examination/treatment/procedure(s) were performed by non-physician practitioner and as supervising physician I was immediately available for consultation/collaboration.   EKG Interpretation None        [name], MD 09/19/13 [phone]

## 2014-07-11 ENCOUNTER — Other Ambulatory Visit: Payer: Self-pay | Admitting: Orthopaedic Surgery

## 2014-07-11 DIAGNOSIS — M25562 Pain in left knee: Secondary | ICD-10-CM

## 2014-07-18 ENCOUNTER — Ambulatory Visit
Admission: RE | Admit: 2014-07-18 | Discharge: 2014-07-18 | Disposition: A | Discharge status: Home / Self Care | Payer: 59 | Source: Ambulatory Visit | Attending: Orthopaedic Surgery | Admitting: Orthopaedic Surgery

## 2014-07-18 ENCOUNTER — Other Ambulatory Visit: Payer: Self-pay

## 2014-07-18 DIAGNOSIS — M25562 Pain in left knee: Secondary | ICD-10-CM

## 2015-08-02 ENCOUNTER — Ambulatory Visit (INDEPENDENT_AMBULATORY_CARE_PROVIDER_SITE_OTHER): Payer: Commercial Managed Care - HMO

## 2015-08-02 ENCOUNTER — Encounter: Payer: Self-pay | Admitting: Podiatry

## 2015-08-02 ENCOUNTER — Ambulatory Visit (INDEPENDENT_AMBULATORY_CARE_PROVIDER_SITE_OTHER): Payer: Commercial Managed Care - HMO | Admitting: Podiatry

## 2015-08-02 VITALS — BP 107/51 | HR 72 | Resp 16

## 2015-08-02 DIAGNOSIS — M722 Plantar fascial fibromatosis: Secondary | ICD-10-CM

## 2015-08-02 DIAGNOSIS — M79673 Pain in unspecified foot: Secondary | ICD-10-CM | POA: Diagnosis not present

## 2015-08-02 MED ORDER — MELOXICAM 15 MG PO TABS: 15.0000 mg | ORAL_TABLET | Freq: Every day | ORAL | Status: DC

## 2015-08-02 MED ORDER — METHYLPREDNISOLONE 4 MG PO TBPK: ORAL_TABLET | ORAL | Status: DC

## 2015-08-02 NOTE — Progress Notes (Signed)
   Subjective:    Patient ID: Michael [name], male    DOB: [birth year], 60 y.o.   MRN: [id number]  HPI: He presents today as a new patient with a chief complaint of a painful left heel times the past several weeks he states this probably been more than 2-3 months. Mornings are particularly bad in any time after he's been sitting for a while and gets back up to walk. He states that he is trying to rest but to no avail.    Review of Systems  Musculoskeletal: Positive for arthralgias and gait problem.  All other systems reviewed and are negative.      Objective:   Physical Exam: Vital signs are stable alert and oriented 3. Pulses are palpable. Neurologic sensorium is intact per Semmes-Weinstein monofilament. Deep tendon reflexes are intact. Muscle strength +5 over 5 dorsiflexion plantar flexors and inverters everters all intrinsic musculature is intact. Orthopedic evaluation of a straight awl joints distal to the ankle for range of motion without crepitation. Cutaneous evaluation demonstrates supple well-hydrated cutis no erythema edema cellulitis drainage or odor. Nail dystrophy due to trauma hallux right. Positive pain on palpation medial calcaneal tubercle of the left heel. Radiograph does demonstrate soft tissue increase in density at the plantar fascia calcaneal insertion site of the left heel.        Assessment & Plan:  Plantar fasciitis left foot.  Plan: We discussed the etiology pathology conservative versus surgical therapies. At this point we also discussed appropriate shoe gear stretching exercises ice therapy and sugar modifications. I injected the left heel today with 20 mg of Kenalog at the point of maximal tenderness. Placement of plantar fascia brace and a night splint. I will follow up with him in 1 month.

## 2015-08-02 NOTE — Patient Instructions (Signed)

## 2015-08-07 ENCOUNTER — Other Ambulatory Visit: Payer: Self-pay | Admitting: Orthopaedic Surgery

## 2015-08-07 DIAGNOSIS — M25512 Pain in left shoulder: Secondary | ICD-10-CM

## 2015-08-12 ENCOUNTER — Ambulatory Visit
Admission: RE | Admit: 2015-08-12 | Discharge: 2015-08-12 | Disposition: A | Discharge status: Home / Self Care | Payer: 59 | Source: Ambulatory Visit | Attending: Orthopaedic Surgery | Admitting: Orthopaedic Surgery

## 2015-08-12 DIAGNOSIS — M25512 Pain in left shoulder: Secondary | ICD-10-CM

## 2015-09-04 ENCOUNTER — Ambulatory Visit (INDEPENDENT_AMBULATORY_CARE_PROVIDER_SITE_OTHER): Payer: Commercial Managed Care - HMO | Admitting: Podiatry

## 2015-09-04 ENCOUNTER — Encounter: Payer: Self-pay | Admitting: Podiatry

## 2015-09-04 DIAGNOSIS — M722 Plantar fascial fibromatosis: Secondary | ICD-10-CM | POA: Diagnosis not present

## 2015-09-04 NOTE — Progress Notes (Signed)
He presents today for follow-up of his plantar fasciitis to his left foot. He states that he is doing much better at about 60% improved. He had to discontinue the meloxicam which seem to be helping because he is having a left shoulder surgery in the near future. He also goes on to say that he injured his ankle when he fell through a ceiling twisting the left ankle. He saw Dr. Durward Fortes who put him in a brace.  Objective: Vital signs are stable alert and oriented 3. He has some tenderness on palpation of the navicular tuberosity from the fall. Otherwise minimal pain on palpation medial calcaneal tubercle of the left heel.  Assessment: Well-healing plantar fasciitis. Injury to the navicular tuberosity and posterior tibial tendon left.  Plan: I encouraged him to resume all conservative therapies once he has finished healing his shoulder surgery. He will notify should his foot continued to be painful after surgery.

## 2015-12-24 ENCOUNTER — Ambulatory Visit (INDEPENDENT_AMBULATORY_CARE_PROVIDER_SITE_OTHER): Payer: Commercial Managed Care - HMO

## 2015-12-24 ENCOUNTER — Encounter: Payer: Self-pay | Admitting: Podiatry

## 2015-12-24 ENCOUNTER — Ambulatory Visit (INDEPENDENT_AMBULATORY_CARE_PROVIDER_SITE_OTHER): Payer: Commercial Managed Care - HMO | Admitting: Podiatry

## 2015-12-24 DIAGNOSIS — M79672 Pain in left foot: Secondary | ICD-10-CM

## 2015-12-24 DIAGNOSIS — S93402D Sprain of unspecified ligament of left ankle, subsequent encounter: Secondary | ICD-10-CM

## 2015-12-24 DIAGNOSIS — M722 Plantar fascial fibromatosis: Secondary | ICD-10-CM

## 2015-12-24 MED ORDER — TRIAMCINOLONE ACETONIDE 10 MG/ML IJ SUSP
10.0000 mg | Freq: Once | INTRAMUSCULAR | Status: AC
  Administered 2015-12-24: 10 mg

## 2015-12-25 NOTE — Progress Notes (Signed)
Subjective:     Patient ID: Michael [name], male   DOB: [birth year], 60 y.o.   MRN: [id number]  HPI patient presents stating I'm having a lot of pain in my left heel and also I sprain my left ankle and that can give me problems at times   Review of Systems     Objective:   Physical Exam Neurovascular status intact with exquisite discomfort plantar aspect left heel at the insertional point tendon into the calcaneus and discomfort in the lateral side of the left ankle and into the Achilles tendon    Assessment:     Plantar fasciitis left with inflammation and sprained ankle left with inflammation    Plan:     H&P x-rays reviewed condition discussed. At this point I have recommended injection of the heel and also physical therapy for the ankle sprain. Patient will be seen back for [place] to recheck

## 2015-12-31 ENCOUNTER — Ambulatory Visit (INDEPENDENT_AMBULATORY_CARE_PROVIDER_SITE_OTHER): Payer: Commercial Managed Care - HMO | Admitting: Orthopaedic Surgery

## 2015-12-31 DIAGNOSIS — M25562 Pain in left knee: Secondary | ICD-10-CM

## 2015-12-31 DIAGNOSIS — M25572 Pain in left ankle and joints of left foot: Secondary | ICD-10-CM

## 2016-01-01 ENCOUNTER — Other Ambulatory Visit (INDEPENDENT_AMBULATORY_CARE_PROVIDER_SITE_OTHER): Payer: Self-pay | Admitting: Orthopaedic Surgery

## 2016-01-01 DIAGNOSIS — M25572 Pain in left ankle and joints of left foot: Secondary | ICD-10-CM

## 2016-01-01 DIAGNOSIS — M79672 Pain in left foot: Secondary | ICD-10-CM

## 2016-01-07 ENCOUNTER — Ambulatory Visit (INDEPENDENT_AMBULATORY_CARE_PROVIDER_SITE_OTHER): Payer: Commercial Managed Care - HMO | Admitting: Podiatry

## 2016-01-07 ENCOUNTER — Encounter: Payer: Self-pay | Admitting: Podiatry

## 2016-01-07 DIAGNOSIS — M722 Plantar fascial fibromatosis: Secondary | ICD-10-CM | POA: Diagnosis not present

## 2016-01-07 DIAGNOSIS — S93402A Sprain of unspecified ligament of left ankle, initial encounter: Secondary | ICD-10-CM | POA: Diagnosis not present

## 2016-01-07 MED ORDER — TRIAMCINOLONE ACETONIDE 10 MG/ML IJ SUSP
10.0000 mg | Freq: Once | INTRAMUSCULAR | Status: AC
  Administered 2016-01-07: 10 mg

## 2016-01-07 NOTE — Patient Instructions (Signed)

## 2016-01-07 NOTE — Progress Notes (Signed)
Subjective:     Patient ID: Michael [name], male   DOB: 1955-04-27, 60 y.o.   MRN: [id number]  HPI patient states my heel has improved some but is still tender and also I'm getting pain in the outside of my left ankle and I am due to have an MRI from my orthopedic doctor. Patient states the pain has improved somewhat on the outside as he walks better but still present   Review of Systems     Objective:   Physical Exam Neurovascular status intact muscle strength adequate with patient still having discomfort in the sinus tarsi left but moderate and not as intense as previous with continued discomfort in the plantar fascial left    Assessment:     Plantar fasciitis left with inflammation fluid buildup along with sinus tarsitis    Plan:     H&P condition reviewed and reinjected the plantar fascial left 3 Milligan Kellogg 5 mill grams Xylocaine and discussed possible sinus tarsi injection left after we get results of the MRI

## 2016-01-19 ENCOUNTER — Ambulatory Visit
Admission: RE | Admit: 2016-01-19 | Discharge: 2016-01-19 | Disposition: A | Discharge status: Home / Self Care | Payer: 59 | Source: Ambulatory Visit | Attending: Orthopaedic Surgery | Admitting: Orthopaedic Surgery

## 2016-01-19 DIAGNOSIS — M79672 Pain in left foot: Secondary | ICD-10-CM

## 2016-01-19 DIAGNOSIS — M25572 Pain in left ankle and joints of left foot: Secondary | ICD-10-CM

## 2016-01-21 ENCOUNTER — Ambulatory Visit (INDEPENDENT_AMBULATORY_CARE_PROVIDER_SITE_OTHER): Payer: Commercial Managed Care - HMO | Admitting: Orthopaedic Surgery

## 2016-01-21 ENCOUNTER — Encounter (INDEPENDENT_AMBULATORY_CARE_PROVIDER_SITE_OTHER): Payer: Self-pay | Admitting: Orthopaedic Surgery

## 2016-01-21 VITALS — BP 155/93 | HR 62 | Resp 12 | Ht 70.0 in | Wt 215.0 lb

## 2016-01-21 DIAGNOSIS — M79672 Pain in left foot: Secondary | ICD-10-CM

## 2016-01-21 DIAGNOSIS — M25572 Pain in left ankle and joints of left foot: Secondary | ICD-10-CM | POA: Diagnosis not present

## 2016-01-21 NOTE — Progress Notes (Deleted)
Office Visit Note   Patient: Michael [name]           Date of Birth: 02-01-1956           MRN: [id number] Visit Date: 01/21/2016              Requested by: Shirline Frees, MD [address], [place] [id number] PCP: Shirline Frees, MD   Assessment & Plan: Visit Diagnoses: No diagnosis found.  Plan: ***  Follow-Up Instructions: No Follow-up on file.   Orders:  No orders of the defined types were placed in this encounter.  Meds ordered this encounter  Medications  . ibuprofen (ADVIL,MOTRIN) 200 MG tablet    Sig: Take 200 mg by mouth every 6 (six) hours as needed.      Procedures: No procedures performed   Clinical Data: No additional findings.   Subjective: Chief Complaint  Patient presents with  . Left Ankle - Pain  . Left Foot - Pain, Injury    Injury     Review of Systems   Objective: Vital Signs: BP (!) 155/93 (BP Location: Right Arm)   Pulse 62   Resp 12   Ht 5\' 10"  (1.778 m)   Wt 215 lb (97.5 kg)   BMI 30.85 kg/m   Physical Exam  Ortho Exam  Specialty Comments:  No specialty comments available.  Imaging: Mr Foot Left Wo Contrast  Result Date: 01/19/2016 CLINICAL DATA:  Left medial foot and ankle pain. Stepped wrists healing while working in May 2017. Twisting foot injury. EXAM: MRI OF THE LEFT ANKLE WITHOUT CONTRAST MRI LEFT FOOT WITHOUT CONTRAST TECHNIQUE: Multiplanar, multisequence MR imaging of the ankle was performed. No intravenous contrast was administered. Multiplanar, multisequence MR imaging of the LEFT FOREFOOT was performed. No intravenous contrast was administered. Full and separate forefoot and ankle protocols were performed. COMPARISON:  None. FINDINGS: MRI ANKLE: TENDONS Peroneal: Os peroneus noted with mild adjacent peroneus longus tendinopathy. Posteromedial: Distal tibialis posterior tendinopathy. Tibialis posterior tenosynovitis posterior to the medial malleolus. Anterior: Unremarkable Achilles:  Unremarkable Plantar Fascia: Expansion and edema of the medial band of the plantar fascia proximally, image 11/5, compatible with plantar fasciitis. LIGAMENTS Lateral: Thickened but continuous anterior inferior tibiofibular ligament. Thickened anterior talofibular ligament with mild adjacent edema. Medial: Abnormal edema signal along the deep tibiotalar portion of the deltoid ligament, image 20/7. Mildly thickened superomedial portion of the spring ligament CARTILAGE Ankle Joint: 5 mm focus of subcortical edema medially in the talar dome with overlying chondral thinning. Adjacent small osteochondral lesion of the medial malleolus, images 19-22 series 7. Subtalar Joints/Sinus Tarsi: Small effusion of the posterior subtalar joint. There is edema in the sinus tarsi. No ligamentous discontinuity in the sinus tarsi identified. Bones: Vascular remnant in the calcaneal neck. MRI FOOT: Field heterogeneity in the first, second, and third toes is the cause for the high T2 signal. I do not see corresponding low T1 signal to further suggest osteomyelitis of the toes. Degenerative loss of articular space and spurring at the first MTP joint, with some medial erosion along the head of the first metatarsal which could be from erosive arthropathy such as gout. However, there is nose significant marrow edema in this area. Lisfranc ligament intact. Mild degenerative findings along the Lisfranc joint especially at the bases of the third and fourth metatarsals. First digit sesamoids unremarkable. No compelling findings of Eden Lathe 's neuroma. IMPRESSION: MRI ankle: 1. Distal tibialis posterior tendinopathy and tenosynovitis, correlate clinically in assessing for tibialis  posterior dysfunction. 2. Mild peroneus longus tendinopathy. 3. Plantar fasciitis. 4. Thickened anterior inferior tibiofibular ligament, potentially from remote injury. Similarly there is thickening of the anterior talofibular ligament with mild adjacent edema which may  reflect sprain. 5. Torn tibiotalar portion of the deltoid ligament with mildly thickened superomedial portion of the spring ligament. 6. 5 mm non-fragmented osteochondral lesion of the medial talar dome with adjacent lesion in the medial malleolus. 7. Small effusion of the posterior subtalar joint. 8. MRI foot: *Degenerative findings at the Lisfranc joint. Lisfranc ligament intact. *Spurring, loss of articular space, and medial first metatarsal head erosion at the first MTP joint, potentially from erosive arthropathy such as gout. No active marrow edema currently. Electronically Signed   By: Van Clines M.D.   On: 01/19/2016 13:03   Mr Ankle Left  Wo Contrast  Result Date: 01/19/2016 CLINICAL DATA:  Left medial foot and ankle pain. Stepped wrists healing while working in May 2017. Twisting foot injury. EXAM: MRI OF THE LEFT ANKLE WITHOUT CONTRAST MRI LEFT FOOT WITHOUT CONTRAST TECHNIQUE: Multiplanar, multisequence MR imaging of the ankle was performed. No intravenous contrast was administered. Multiplanar, multisequence MR imaging of the LEFT FOREFOOT was performed. No intravenous contrast was administered. Full and separate forefoot and ankle protocols were performed. COMPARISON:  None. FINDINGS: MRI ANKLE: TENDONS Peroneal: Os peroneus noted with mild adjacent peroneus longus tendinopathy. Posteromedial: Distal tibialis posterior tendinopathy. Tibialis posterior tenosynovitis posterior to the medial malleolus. Anterior: Unremarkable Achilles: Unremarkable Plantar Fascia: Expansion and edema of the medial band of the plantar fascia proximally, image 11/5, compatible with plantar fasciitis. LIGAMENTS Lateral: Thickened but continuous anterior inferior tibiofibular ligament. Thickened anterior talofibular ligament with mild adjacent edema. Medial: Abnormal edema signal along the deep tibiotalar portion of the deltoid ligament, image 20/7. Mildly thickened superomedial portion of the spring ligament  CARTILAGE Ankle Joint: 5 mm focus of subcortical edema medially in the talar dome with overlying chondral thinning. Adjacent small osteochondral lesion of the medial malleolus, images 19-22 series 7. Subtalar Joints/Sinus Tarsi: Small effusion of the posterior subtalar joint. There is edema in the sinus tarsi. No ligamentous discontinuity in the sinus tarsi identified. Bones: Vascular remnant in the calcaneal neck. MRI FOOT: Field heterogeneity in the first, second, and third toes is the cause for the high T2 signal. I do not see corresponding low T1 signal to further suggest osteomyelitis of the toes. Degenerative loss of articular space and spurring at the first MTP joint, with some medial erosion along the head of the first metatarsal which could be from erosive arthropathy such as gout. However, there is nose significant marrow edema in this area. Lisfranc ligament intact. Mild degenerative findings along the Lisfranc joint especially at the bases of the third and fourth metatarsals. First digit sesamoids unremarkable. No compelling findings of Eden Lathe 's neuroma. IMPRESSION: MRI ankle: 1. Distal tibialis posterior tendinopathy and tenosynovitis, correlate clinically in assessing for tibialis posterior dysfunction. 2. Mild peroneus longus tendinopathy. 3. Plantar fasciitis. 4. Thickened anterior inferior tibiofibular ligament, potentially from remote injury. Similarly there is thickening of the anterior talofibular ligament with mild adjacent edema which may reflect sprain. 5. Torn tibiotalar portion of the deltoid ligament with mildly thickened superomedial portion of the spring ligament. 6. 5 mm non-fragmented osteochondral lesion of the medial talar dome with adjacent lesion in the medial malleolus. 7. Small effusion of the posterior subtalar joint. 8. MRI foot: *Degenerative findings at the Lisfranc joint. Lisfranc ligament intact. *Spurring, loss of articular space, and medial first metatarsal  head erosion  at the first MTP joint, potentially from erosive arthropathy such as gout. No active marrow edema currently. Electronically Signed   By: Van Clines M.D.   On: 01/19/2016 13:03   Dg Foot 2 Views Left  Result Date: 12/26/2015 See progress note.    PMFS History: Patient Active Problem List   Diagnosis Date Noted  . Osteoarthritis of acromioclavicular joint 04/05/2013  . Impingement syndrome of right shoulder 04/05/2013   Past Medical History:  Diagnosis Date  . Arthritis   . Hyperlipemia   . Medical history non-contributory   . Wears glasses     No family history on file.  Past Surgical History:  Procedure Laterality Date  . COLONOSCOPY    . KNEE ARTHROSCOPY  9/12   left  . SHOULDER ARTHROSCOPY WITH OPEN ROTATOR CUFF REPAIR AND DISTAL CLAVICLE ACROMINECTOMY Right 04/05/2013   Procedure: SHOULDER ARTHROSCOPIC DEBRIDEMENT, ACROMIOPLASTY, DISTAL CLAVICLE RESECTION, POSSIBLE MINI OPEN ROTATOR CUFF REPAIR.  ;  Surgeon: Garald Balding, MD;  Location: [facility];  Service: Orthopedics;  Laterality: Right;  ARTHROSCOPIC DEBRIDEMENT SHOULDER, ACORMIOPLASTY, DISTAL CLAVICLE RESECTION, POSSIBLE MINI OPEN ROTATOR CUFF REPAIR.     Social History   Occupational History  . Not on file.   Social History Main Topics  . Smoking status: Never Smoker  . Smokeless tobacco: Not on file  . Alcohol use Yes     Comment: occ  . Drug use: No  . Sexual activity: Not on file

## 2016-01-21 NOTE — Progress Notes (Deleted)
He came into the office today as a work in. [employer] company has been working in [facility] and he is on his way to that location. I have discussed the MRI scan of his left ankle and left foot. He still having some discomfort after the injury in May as previously outlined.  The MRI scan demonstrates midfoot arthritis as well as some early degenerative changes at the first metatarsal phalangeal joint. There is a sprain of the anterior tip-fib ligament. There is a sprain of the deltoid ligament. Tendinopathy of the posterior tibial tendon is also present.  I don't see anything that would require surgery. He does have a good supportive ankle support at home that I suggested he utilize.  On exam of his left foot. There was no edema. Neurovascular exam was intact. There was no evidence of hallux rigidus. There was no edema. There was minimal tenderness along the posterior tibial tendon and the deltoid ligament. I did not appreciate an ankle synovitis. There was no tenderness laterally.  Given time and appropriate immobilization that he'll do just fine. Plan to see him back on a when necessary basis. He also mentioned that his shoulder is doing well after the above-mentioned surgery.

## 2016-01-21 NOTE — Progress Notes (Addendum)
Office Visit Note   Patient: Michael [name]           Date of Birth: 1955-10-13           MRN: [id number] Visit Date: 01/21/2016              Requested by: Shirline Frees, MD [place], [place] [address] PCP: Shirline Frees, MD   Assessment & Plan: Visit Diagnoses:  1. Left foot pain   2. 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 I have discussed the MRI scan of his left ankle and left foot. He still having some discomfort after the injury in May as previously outlined.  The MRI scan demonstrates midfoot arthritis as well as some early degenerative changes at the first metatarsal phalangeal joint. There is a sprain of the anterior tip-fib ligament. There is a sprain of the deltoid ligament. Tendinopathy of the posterior tibial tendon is also present.  I don't see anything that would require surgery. He does have a good supportive ankle support at home that I suggested he utilize.  On exam of his left foot. There was no edema. Neurovascular exam was intact. There was no evidence of hallux rigidus. There was no edema. There was minimal tenderness along the posterior tibial tendon and the deltoid ligament. I did not appreciate an ankle synovitis. There was no tenderness laterally.  Given time and appropriate immobilization that he'll do just fine. Plan to see him back on a when necessary basis. He also mentioned that his shoulder is doing well after the above-mentioned surgery.  Follow-Up Instructions: Return if symptoms worsen or fail to improve.   Orders:  No orders of the defined types were placed in this encounter.  Meds ordered this encounter  Medications  . ibuprofen (ADVIL,MOTRIN) 200 MG tablet    Sig: Take 200 mg by mouth every 6 (six) hours as needed.      Procedures: No procedures  performed   Clinical Data: No additional findings.   Subjective: Chief Complaint  Patient presents with  . Left Ankle - Pain  . Left Foot - Pain, Injury    HPI  Review of Systems   Objective: Vital Signs: BP (!) 155/93 (BP Location: Right Arm)   Pulse 62   Resp 12   Ht 5\' 10"  (1.778 m)   Wt 215 lb (97.5 kg)   BMI 30.85 kg/m   Physical Exam  Ortho Exam  Specialty Comments:  No specialty comments available.  Imaging: Mr Foot Left Wo Contrast  Result Date: 01/19/2016 CLINICAL DATA:  Left medial foot and ankle pain. Stepped wrists healing while working in May 2017. Twisting foot injury. EXAM: MRI OF THE LEFT ANKLE WITHOUT CONTRAST MRI LEFT FOOT WITHOUT CONTRAST TECHNIQUE: Multiplanar, multisequence MR imaging of the ankle was performed. No intravenous contrast was administered. Multiplanar, multisequence MR imaging of the LEFT FOREFOOT was performed. No intravenous contrast was administered. Full and separate forefoot and ankle protocols were performed. COMPARISON:  None. FINDINGS: MRI ANKLE: TENDONS Peroneal: Os peroneus noted with mild adjacent peroneus longus tendinopathy. Posteromedial: Distal tibialis posterior tendinopathy. Tibialis posterior tenosynovitis posterior to the medial malleolus. Anterior: Unremarkable Achilles: Unremarkable Plantar Fascia: Expansion and edema of the medial band of the plantar fascia proximally, image 11/5, compatible with  plantar fasciitis. LIGAMENTS Lateral: Thickened but continuous anterior inferior tibiofibular ligament. Thickened anterior talofibular ligament with mild adjacent edema. Medial: Abnormal edema signal along the deep tibiotalar portion of the deltoid ligament, image 20/7. Mildly thickened superomedial portion of the spring ligament CARTILAGE Ankle Joint: 5 mm focus of subcortical edema medially in the talar dome with overlying chondral thinning. Adjacent small osteochondral lesion of the medial malleolus, images 19-22 series 7.  Subtalar Joints/Sinus Tarsi: Small effusion of the posterior subtalar joint. There is edema in the sinus tarsi. No ligamentous discontinuity in the sinus tarsi identified. Bones: Vascular remnant in the calcaneal neck. MRI FOOT: Field heterogeneity in the first, second, and third toes is the cause for the high T2 signal. I do not see corresponding low T1 signal to further suggest osteomyelitis of the toes. Degenerative loss of articular space and spurring at the first MTP joint, with some medial erosion along the head of the first metatarsal which could be from erosive arthropathy such as gout. However, there is nose significant marrow edema in this area. Lisfranc ligament intact. Mild degenerative findings along the Lisfranc joint especially at the bases of the third and fourth metatarsals. First digit sesamoids unremarkable. No compelling findings of Eden Lathe 's neuroma. IMPRESSION: MRI ankle: 1. Distal tibialis posterior tendinopathy and tenosynovitis, correlate clinically in assessing for tibialis posterior dysfunction. 2. Mild peroneus longus tendinopathy. 3. Plantar fasciitis. 4. Thickened anterior inferior tibiofibular ligament, potentially from remote injury. Similarly there is thickening of the anterior talofibular ligament with mild adjacent edema which may reflect sprain. 5. Torn tibiotalar portion of the deltoid ligament with mildly thickened superomedial portion of the spring ligament. 6. 5 mm non-fragmented osteochondral lesion of the medial talar dome with adjacent lesion in the medial malleolus. 7. Small effusion of the posterior subtalar joint. 8. MRI foot: *Degenerative findings at the Lisfranc joint. Lisfranc ligament intact. *Spurring, loss of articular space, and medial first metatarsal head erosion at the first MTP joint, potentially from erosive arthropathy such as gout. No active marrow edema currently. Electronically Signed   By: Van Clines M.D.   On: 01/19/2016 13:03   Mr Ankle Left   Wo Contrast  Result Date: 01/19/2016 CLINICAL DATA:  Left medial foot and ankle pain. Stepped wrists healing while working in May 2017. Twisting foot injury. EXAM: MRI OF THE LEFT ANKLE WITHOUT CONTRAST MRI LEFT FOOT WITHOUT CONTRAST TECHNIQUE: Multiplanar, multisequence MR imaging of the ankle was performed. No intravenous contrast was administered. Multiplanar, multisequence MR imaging of the LEFT FOREFOOT was performed. No intravenous contrast was administered. Full and separate forefoot and ankle protocols were performed. COMPARISON:  None. FINDINGS: MRI ANKLE: TENDONS Peroneal: Os peroneus noted with mild adjacent peroneus longus tendinopathy. Posteromedial: Distal tibialis posterior tendinopathy. Tibialis posterior tenosynovitis posterior to the medial malleolus. Anterior: Unremarkable Achilles: Unremarkable Plantar Fascia: Expansion and edema of the medial band of the plantar fascia proximally, image 11/5, compatible with plantar fasciitis. LIGAMENTS Lateral: Thickened but continuous anterior inferior tibiofibular ligament. Thickened anterior talofibular ligament with mild adjacent edema. Medial: Abnormal edema signal along the deep tibiotalar portion of the deltoid ligament, image 20/7. Mildly thickened superomedial portion of the spring ligament CARTILAGE Ankle Joint: 5 mm focus of subcortical edema medially in the talar dome with overlying chondral thinning. Adjacent small osteochondral lesion of the medial malleolus, images 19-22 series 7. Subtalar Joints/Sinus Tarsi: Small effusion of the posterior subtalar joint. There is edema in the sinus tarsi. No ligamentous discontinuity in the sinus tarsi identified. Bones: Vascular remnant in  the calcaneal neck. MRI FOOT: Field heterogeneity in the first, second, and third toes is the cause for the high T2 signal. I do not see corresponding low T1 signal to further suggest osteomyelitis of the toes. Degenerative loss of articular space and spurring at the  first MTP joint, with some medial erosion along the head of the first metatarsal which could be from erosive arthropathy such as gout. However, there is nose significant marrow edema in this area. Lisfranc ligament intact. Mild degenerative findings along the Lisfranc joint especially at the bases of the third and fourth metatarsals. First digit sesamoids unremarkable. No compelling findings of Eden Lathe 's neuroma. IMPRESSION: MRI ankle: 1. Distal tibialis posterior tendinopathy and tenosynovitis, correlate clinically in assessing for tibialis posterior dysfunction. 2. Mild peroneus longus tendinopathy. 3. Plantar fasciitis. 4. Thickened anterior inferior tibiofibular ligament, potentially from remote injury. Similarly there is thickening of the anterior talofibular ligament with mild adjacent edema which may reflect sprain. 5. Torn tibiotalar portion of the deltoid ligament with mildly thickened superomedial portion of the spring ligament. 6. 5 mm non-fragmented osteochondral lesion of the medial talar dome with adjacent lesion in the medial malleolus. 7. Small effusion of the posterior subtalar joint. 8. MRI foot: *Degenerative findings at the Lisfranc joint. Lisfranc ligament intact. *Spurring, loss of articular space, and medial first metatarsal head erosion at the first MTP joint, potentially from erosive arthropathy such as gout. No active marrow edema currently. Electronically Signed   By: Van Clines M.D.   On: 01/19/2016 13:03   Dg Foot 2 Views Left  Result Date: 12/26/2015 See progress note.    PMFS History: Patient Active Problem List   Diagnosis Date Noted  . Osteoarthritis of acromioclavicular joint 04/05/2013  . Impingement syndrome of right shoulder 04/05/2013   Past Medical History:  Diagnosis Date  . Arthritis   . Hyperlipemia   . Medical history non-contributory   . Wears glasses     No family history on file.  Past Surgical History:  Procedure Laterality Date  .  COLONOSCOPY    . KNEE ARTHROSCOPY  9/12   left  . SHOULDER ARTHROSCOPY WITH OPEN ROTATOR CUFF REPAIR AND DISTAL CLAVICLE ACROMINECTOMY Right 04/05/2013   Procedure: SHOULDER ARTHROSCOPIC DEBRIDEMENT, ACROMIOPLASTY, DISTAL CLAVICLE RESECTION, POSSIBLE MINI OPEN ROTATOR CUFF REPAIR.  ;  Surgeon: Garald Balding, MD;  Location: [facility];  Service: Orthopedics;  Laterality: Right;  ARTHROSCOPIC DEBRIDEMENT SHOULDER, ACORMIOPLASTY, DISTAL CLAVICLE RESECTION, POSSIBLE MINI OPEN ROTATOR CUFF REPAIR.     Social History   Occupational History  . Not on file.   Social History Main Topics  . Smoking status: Never Smoker  . Smokeless tobacco: Not on file  . Alcohol use Yes     Comment: occ  . Drug use: No  . Sexual activity: Not on file

## 2016-02-11 ENCOUNTER — Ambulatory Visit: Payer: Commercial Managed Care - HMO | Admitting: Podiatry

## 2016-02-18 ENCOUNTER — Ambulatory Visit: Payer: Commercial Managed Care - HMO | Admitting: Podiatry

## 2016-02-28 ENCOUNTER — Encounter: Payer: Self-pay | Admitting: Podiatry

## 2016-02-28 ENCOUNTER — Ambulatory Visit (INDEPENDENT_AMBULATORY_CARE_PROVIDER_SITE_OTHER): Payer: Commercial Managed Care - HMO | Admitting: Podiatry

## 2016-02-28 DIAGNOSIS — M722 Plantar fascial fibromatosis: Secondary | ICD-10-CM

## 2016-02-28 DIAGNOSIS — M76822 Posterior tibial tendinitis, left leg: Secondary | ICD-10-CM

## 2016-02-29 ENCOUNTER — Encounter: Payer: Self-pay | Admitting: Podiatry

## 2016-02-29 NOTE — Progress Notes (Signed)
Subjective: 60 year old male presents the office today for follow-up evaluation of left foot and ankle pain. He states he is doing much better. He still has some mild plantar fasciitis symptoms as the pain he points to the medial aspect of the foot on the navicular tuberosity however overall he is doing much better. He does continue with ankle brace as well. 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 He's had no recent injury. Denies any systemic complaints such as fevers, chills, nausea, vomiting. No acute changes since last appointment, and no other complaints at this time.   Objective: AAO x3, NAD DP/PT pulses palpable bilaterally, CRT less than 3 seconds There is mild tenderness to palpation along the plantar medial tubercle of the calcaneus at the insertion of plantar fascia on the left foot. There is no pain along the course of the plantar fascia within the arch of the foot. Plantar fascia appears to be intact. There is no pain with lateral compression of the calcaneus or pain with vibratory sensation. There is no pain along the course or insertion of the achilles tendon. There is mild tenderness along the navicular tuberosity. There is no pain on the course of the posterior tibial tendon otherwise. There is no other areas of tenderness. No pain with ankle, subtalar joint range of motion. No other areas of tenderness to bilateral lower extremities. No open lesions or pre-ulcerative lesions.  No pain with calf compression, swelling, warmth, erythema  Assessment: Resolving right foot/ankle pain with mild plantar fasciitis, insertional posterior tibial tendinitis, muscle tightness  Plan: -All treatment options discussed with the patient including all alternatives, risks, complications.  Patient elects to proceed with steroid injection into the left heel. Under sterile skin  preparation, a total of 2.5cc of kenalog 10, 0.5% Marcaine plain, and 2% lidocaine plain were infiltrated into the symptomatic area without complication. A band-aid was applied. Patient tolerated the injection well without complication. Post-injection care with discussed with the patient. Discussed with the patient to ice the area over the next couple of days to help prevent a steroid flare. Continue stretching, icing at home. Continue with orthotics which she'll he has. -At this time given the tightness to the left muscles as he is accompanied for some time due to the foot pain we will start physical therapy. A prescription was provided today for this. -Patient encouraged to call the office with any questions, concerns, change in symptoms.   Celesta Gentile, DPM

## 2016-04-01 DIAGNOSIS — M25572 Pain in left ankle and joints of left foot: Secondary | ICD-10-CM | POA: Diagnosis not present

## 2016-04-01 DIAGNOSIS — M25672 Stiffness of left ankle, not elsewhere classified: Secondary | ICD-10-CM | POA: Diagnosis not present

## 2016-04-01 DIAGNOSIS — M79672 Pain in left foot: Secondary | ICD-10-CM | POA: Diagnosis not present

## 2016-04-03 DIAGNOSIS — M25572 Pain in left ankle and joints of left foot: Secondary | ICD-10-CM | POA: Diagnosis not present

## 2016-04-03 DIAGNOSIS — M25672 Stiffness of left ankle, not elsewhere classified: Secondary | ICD-10-CM | POA: Diagnosis not present

## 2016-04-03 DIAGNOSIS — M79672 Pain in left foot: Secondary | ICD-10-CM | POA: Diagnosis not present

## 2016-04-09 DIAGNOSIS — M25572 Pain in left ankle and joints of left foot: Secondary | ICD-10-CM | POA: Diagnosis not present

## 2016-04-09 DIAGNOSIS — M25672 Stiffness of left ankle, not elsewhere classified: Secondary | ICD-10-CM | POA: Diagnosis not present

## 2016-04-09 DIAGNOSIS — M79672 Pain in left foot: Secondary | ICD-10-CM | POA: Diagnosis not present

## 2016-04-10 ENCOUNTER — Ambulatory Visit (INDEPENDENT_AMBULATORY_CARE_PROVIDER_SITE_OTHER): Payer: Commercial Managed Care - HMO | Admitting: Orthopaedic Surgery

## 2016-04-10 ENCOUNTER — Ambulatory Visit: Payer: Commercial Managed Care - HMO | Admitting: Podiatry

## 2016-04-11 DIAGNOSIS — M25572 Pain in left ankle and joints of left foot: Secondary | ICD-10-CM | POA: Diagnosis not present

## 2016-04-11 DIAGNOSIS — M25672 Stiffness of left ankle, not elsewhere classified: Secondary | ICD-10-CM | POA: Diagnosis not present

## 2016-04-11 DIAGNOSIS — M79672 Pain in left foot: Secondary | ICD-10-CM | POA: Diagnosis not present

## 2016-04-15 DIAGNOSIS — M25672 Stiffness of left ankle, not elsewhere classified: Secondary | ICD-10-CM | POA: Diagnosis not present

## 2016-04-15 DIAGNOSIS — M79672 Pain in left foot: Secondary | ICD-10-CM | POA: Diagnosis not present

## 2016-04-15 DIAGNOSIS — M25572 Pain in left ankle and joints of left foot: Secondary | ICD-10-CM | POA: Diagnosis not present

## 2016-04-22 DIAGNOSIS — M25672 Stiffness of left ankle, not elsewhere classified: Secondary | ICD-10-CM | POA: Diagnosis not present

## 2016-04-22 DIAGNOSIS — M79672 Pain in left foot: Secondary | ICD-10-CM | POA: Diagnosis not present

## 2016-04-22 DIAGNOSIS — M25572 Pain in left ankle and joints of left foot: Secondary | ICD-10-CM | POA: Diagnosis not present

## 2016-04-24 DIAGNOSIS — M25672 Stiffness of left ankle, not elsewhere classified: Secondary | ICD-10-CM | POA: Diagnosis not present

## 2016-04-24 DIAGNOSIS — M25572 Pain in left ankle and joints of left foot: Secondary | ICD-10-CM | POA: Diagnosis not present

## 2016-04-24 DIAGNOSIS — M79672 Pain in left foot: Secondary | ICD-10-CM | POA: Diagnosis not present

## 2016-05-01 ENCOUNTER — Ambulatory Visit: Payer: Commercial Managed Care - HMO | Admitting: Podiatry

## 2016-05-18 ENCOUNTER — Other Ambulatory Visit: Payer: Self-pay | Admitting: Podiatry

## 2016-05-19 NOTE — Telephone Encounter (Signed)
Pt needs an appt prior to future refills. 

## 2016-05-27 ENCOUNTER — Ambulatory Visit (INDEPENDENT_AMBULATORY_CARE_PROVIDER_SITE_OTHER): Payer: Commercial Managed Care - HMO | Admitting: Orthopaedic Surgery

## 2016-05-27 ENCOUNTER — Encounter (INDEPENDENT_AMBULATORY_CARE_PROVIDER_SITE_OTHER): Payer: Self-pay | Admitting: Orthopaedic Surgery

## 2016-05-27 ENCOUNTER — Ambulatory Visit (INDEPENDENT_AMBULATORY_CARE_PROVIDER_SITE_OTHER): Payer: Commercial Managed Care - HMO

## 2016-05-27 VITALS — BP 152/81 | HR 83 | Resp 14 | Ht 70.0 in | Wt 218.0 lb

## 2016-05-27 DIAGNOSIS — G8929 Other chronic pain: Secondary | ICD-10-CM

## 2016-05-27 DIAGNOSIS — M25562 Pain in left knee: Secondary | ICD-10-CM | POA: Diagnosis not present

## 2016-05-27 MED ORDER — METHYLPREDNISOLONE ACETATE 40 MG/ML IJ SUSP
80.0000 mg | INTRAMUSCULAR | Status: AC | PRN
  Administered 2016-05-27: 80 mg

## 2016-05-27 MED ORDER — BUPIVACAINE HCL 0.5 % IJ SOLN
3.0000 mL | INTRAMUSCULAR | Status: AC | PRN
  Administered 2016-05-27: 3 mL via INTRA_ARTICULAR

## 2016-05-27 MED ORDER — LIDOCAINE HCL 1 % IJ SOLN
5.0000 mL | INTRAMUSCULAR | Status: AC | PRN
  Administered 2016-05-27: 5 mL

## 2016-05-27 NOTE — Progress Notes (Signed)
Office Visit Note   Patient: Michael [name]           Date of Birth: [birth year]           MRN: [id number] Visit Date: 05/27/2016              Requested by: Shirline Frees, MD [place], [place] [address] PCP: Shirline Frees, MD   Assessment & Plan: Visit Diagnoses: Chronic left knee pain that could be consistent with osteoarthritis. Possible lateral meniscal tear   Plan: Cortisone injection and the lateral compartment left knee. Michael [name] will let me know the response over the next 3-4 weeks and if still having symptoms consider an MRI scan   Follow-Up Instructions: No Follow-up on file.   Orders:  No orders of the defined types were placed in this encounter.  No orders of the defined types were placed in this encounter.     Procedures: Large Joint Inj Date/Time: 05/27/2016 4:34 PM Performed by: Garald Balding Authorized by: Garald Balding   Consent Given by:  Patient Timeout: prior to procedure the correct patient, procedure, and site was verified   Indications:  Pain and joint swelling Location:  Knee Site:  L knee Prep: patient was prepped and draped in usual sterile fashion   Needle Size:  25 G Needle Length:  1.5 inches Approach:  Anteromedial Ultrasound Guidance: No   Fluoroscopic Guidance: No   Arthrogram: No   Medications:  5 mL lidocaine 1 %; 80 mg methylPREDNISolone acetate 40 MG/ML; 3 mL bupivacaine 0.5 % Aspiration Attempted: No   Patient tolerance:  Patient tolerated the procedure well with no immediate complications     Clinical Data: No additional findings.   Subjective: Chief Complaint  Patient presents with  . Left Knee - Pain    Left knee pain x 6 months,  08/09/15 fell through ceiling at job site, saw Dr. Jacqualyn Posey for ankle issues - new orthopedics, PT, swelling at times,  more pain when sitting, difficulty bearing weight after sitting for a period of time, locking, knot on lateral side of knee at times - able  to massage away, difficulty sleeping - pain every night waking patient from sleep, popping, clicking, grinding noises, difficulty walking at times, IBU - doesn't help much. Not diabetic, arthroscopic knee surgery in past.  Michael [name] has a remote history of left knee arthroscopy for a tear of the medial meniscus. He is actually experiencing some discomfort along the lateral joint on occasion and believes it may be related to his injury in May 2017.  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 Positive patellar crepitation. No effusion. Mild lateral joint discomfort more posterior than anterior. Full range of motion. No instability. No popliteal mass. No calf pain or ankle swelling. Neurovascular exam intact. Negative McMurray's.  Specialty Comments:  No specialty comments available.  Imaging: No results found.   PMFS History: Patient Active Problem List   Diagnosis Date Noted  . Osteoarthritis of acromioclavicular joint 04/05/2013  . Impingement syndrome of right shoulder 04/05/2013   Past Medical History:  Diagnosis Date  . Arthritis   .  Hyperlipemia   . Medical history non-contributory   . Wears glasses     History reviewed. No pertinent family history.  Past Surgical History:  Procedure Laterality Date  . COLONOSCOPY    . KNEE ARTHROSCOPY  9/12   left  . SHOULDER ARTHROSCOPY WITH OPEN ROTATOR CUFF REPAIR AND DISTAL CLAVICLE ACROMINECTOMY Right 04/05/2013   Procedure: SHOULDER ARTHROSCOPIC DEBRIDEMENT, ACROMIOPLASTY, DISTAL CLAVICLE RESECTION, POSSIBLE MINI OPEN ROTATOR CUFF REPAIR.  ;  Surgeon: Garald Balding, MD;  Location: [facility];  Service: Orthopedics;  Laterality:  Right;  ARTHROSCOPIC DEBRIDEMENT SHOULDER, ACORMIOPLASTY, DISTAL CLAVICLE RESECTION, POSSIBLE MINI OPEN ROTATOR CUFF REPAIR.     Social History   Occupational History  . Not on file.   Social History Main Topics  . Smoking status: Never Smoker  . Smokeless tobacco: Never Used  . Alcohol use Yes     Comment: occ  . Drug use: No  . Sexual activity: Not on file

## 2016-06-02 ENCOUNTER — Ambulatory Visit (INDEPENDENT_AMBULATORY_CARE_PROVIDER_SITE_OTHER): Payer: Commercial Managed Care - HMO | Admitting: Podiatry

## 2016-06-02 ENCOUNTER — Encounter: Payer: Self-pay | Admitting: Podiatry

## 2016-06-02 VITALS — BP 146/97 | HR 87 | Resp 16

## 2016-06-02 DIAGNOSIS — M659 Synovitis and tenosynovitis, unspecified: Secondary | ICD-10-CM

## 2016-06-02 DIAGNOSIS — M25572 Pain in left ankle and joints of left foot: Secondary | ICD-10-CM

## 2016-06-02 DIAGNOSIS — M722 Plantar fascial fibromatosis: Secondary | ICD-10-CM | POA: Diagnosis not present

## 2016-06-02 DIAGNOSIS — M7752 Other enthesopathy of left foot: Secondary | ICD-10-CM | POA: Diagnosis not present

## 2016-06-02 DIAGNOSIS — M79672 Pain in left foot: Secondary | ICD-10-CM | POA: Diagnosis not present

## 2016-06-02 MED ORDER — BETAMETHASONE SOD PHOS & ACET 6 (3-3) MG/ML IJ SUSP
3.0000 mg | Freq: Once | INTRAMUSCULAR | Status: DC
Start: 2016-06-02 — End: 2020-09-04

## 2016-06-02 NOTE — Progress Notes (Signed)
   Subjective:  Patient of Dr. Jacqualyn Posey presents today for follow-up evaluation treatment of left foot and ankle pain. 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 Patient has had significant pain and tenderness over since. Patient received an injection on last visit 02/28/2016. Patient states the injection didn't help. He also states that physical therapy is helping.    Objective/Physical Exam General: The patient is alert and oriented x3 in no acute distress.  Dermatology: Skin is warm, dry and supple bilateral lower extremities. Negative for open lesions or macerations.  Vascular: Palpable pedal pulses bilaterally. No edema or erythema noted. Capillary refill within normal limits.  Neurological: Epicritic and protective threshold grossly intact bilaterally.   Musculoskeletal Exam: Range of motion within normal limits to all pedal and ankle joints bilateral. Muscle strength 5/5 in all groups bilateral.   Assessment: #1 ankle joint synovitis left ankle - medial #2 deltoid ankle ligament compromise left #3 plantar fasciitis left   Plan of Care:  #1 Patient was evaluated. #2 injection of 0.5 mL Celestone Soluspan injected in the patient's left ankle joint line #3 injection of 0.5 mL Celestone injected in the patient's left plantar fascia at the medial calcaneal tubercle #4 continue conservative modalities including physical therapy, orthotics, good shoe gear. #5 return to clinic in 4 weeks  Edrick Kins, DPM [facility]  Dr. Edrick Kins, [address]                                        [place], [place] [address]                Office [phone]  Fax [phone]

## 2016-07-03 ENCOUNTER — Telehealth: Payer: Self-pay | Admitting: *Deleted

## 2016-07-03 NOTE — Telephone Encounter (Signed)
Received refill request for Meloxicam. Dr. Milinda Pointer had requested pt make an appt 4 weeks after last visit. Return fax rx denied.

## 2016-07-07 ENCOUNTER — Ambulatory Visit: Payer: Commercial Managed Care - HMO | Admitting: Podiatry

## 2016-11-17 DIAGNOSIS — S61012A Laceration without foreign body of left thumb without damage to nail, initial encounter: Secondary | ICD-10-CM | POA: Diagnosis not present

## 2016-11-20 DIAGNOSIS — L859 Epidermal thickening, unspecified: Secondary | ICD-10-CM | POA: Diagnosis not present

## 2016-11-20 DIAGNOSIS — L723 Sebaceous cyst: Secondary | ICD-10-CM | POA: Diagnosis not present

## 2016-11-20 DIAGNOSIS — L821 Other seborrheic keratosis: Secondary | ICD-10-CM | POA: Diagnosis not present

## 2016-12-31 ENCOUNTER — Encounter (INDEPENDENT_AMBULATORY_CARE_PROVIDER_SITE_OTHER): Payer: Self-pay | Admitting: Orthopedic Surgery

## 2016-12-31 ENCOUNTER — Ambulatory Visit (INDEPENDENT_AMBULATORY_CARE_PROVIDER_SITE_OTHER): Payer: 59

## 2016-12-31 ENCOUNTER — Ambulatory Visit (INDEPENDENT_AMBULATORY_CARE_PROVIDER_SITE_OTHER): Payer: 59 | Admitting: Orthopedic Surgery

## 2016-12-31 VITALS — BP 132/77 | HR 78 | Ht 70.0 in | Wt 216.0 lb

## 2016-12-31 DIAGNOSIS — M25562 Pain in left knee: Secondary | ICD-10-CM

## 2016-12-31 DIAGNOSIS — G8929 Other chronic pain: Secondary | ICD-10-CM

## 2016-12-31 MED ORDER — DICLOFENAC SODIUM 1 % TD GEL: 2.0000 g | Freq: Four times a day (QID) | TRANSDERMAL | 1 refills | Status: DC

## 2016-12-31 NOTE — Progress Notes (Signed)
Office Visit Note   Patient: Michael [name]           Date of Birth: 06/22/1955           MRN: [id number] Visit Date: 12/31/2016              Requested by: Michael Frees, MD [place], [place] [address] PCP: Michael Frees, MD   Assessment & Plan: Visit Diagnoses:  1. Chronic pain of left knee     Plan:  #1: MRI scan of the left knee to rule out lateral meniscal tear as well as a possible loose body in the posterior lateral aspect of the knee. #2: Prescription for Voltaren gel to be used 3-4 times a day  Follow-Up Instructions: Return for review of mri.   Orders:  Orders Placed This Encounter  Procedures  . XR Knee Complete 4 Views Left  . MR Knee Left w/o contrast   Meds ordered this encounter  Medications  . diclofenac sodium (VOLTAREN) 1 % GEL    Sig: Apply 2-4 g topically 4 (four) times daily.    Dispense:  5 Tube    Refill:  1    Order Specific Question:   Supervising Provider    Answer:   Michael [name] [[id number]]      Procedures: No procedures performed   Clinical Data: No additional findings.   Subjective: Chief Complaint  Patient presents with  . Follow-up    L knee pain & weakness, no injury    HPI  Michael [name] is a very pleasant 61 year old white male who is seen today for reevaluation of left knee pain. His history is that back in 08/09/2015 he fell through a ceiling at a job site. He was seen by Michael [name] for some ankle issues. He had has physical therapy and is noted to have swelling at times. He had difficulty weightbearing after sitting for prolonged periods of time. It would occasionally lock up on him. He also had some swelling along the lateral aspect of the knee. He was seen 01/24/2017 with the symptoms by Michael [name]. He also had pain every night which awaken from his sleep. He had popping and clicking and grinding noises. At times had difficulty walking. The corticosteroid injection was performed and he states that  this was beneficial for several months. However now he is having difficulty regaining his flexion. His pain is more in the posterior lateral aspect of the knee. He is having difficulty getting his pants on because of the lack of flexion and the pain in the knee. He is seen today for reevaluation.  Review of Systems  Constitutional: Negative.   HENT: Negative.   Respiratory: Negative.   Cardiovascular: Negative.   Gastrointestinal: Negative.   Genitourinary: Negative.   Skin: Negative.   Neurological: Negative.   Hematological: Negative.   Psychiatric/Behavioral: Negative.      Objective: Vital Signs: BP 132/77 (BP Location: Right Arm, Patient Position: Sitting, Cuff Size: Normal)   Pulse 78   Ht 5\' 10"  (1.778 m)   Wt 216 lb (98 kg)   BMI 30.99 kg/m   Physical Exam  Constitutional: He is oriented to person, place, and time. He appears well-developed and well-nourished.  HENT:  Head: Normocephalic and atraumatic.  Eyes: Pupils are equal, round, and reactive to light. EOM are normal.  Pulmonary/Chest: Effort normal.  Neurological: He is alert and oriented to person, place, and time.  Skin: Skin is warm and dry.  Psychiatric: He has a normal mood and affect. His behavior is normal. Judgment and thought content normal.    Ortho Exam  Today he has a mild effusion in the left knee. He lacks full extension by about 3-5. Flexion I can only get him to about 90-95 before he stops A. He does have a palpable Baker cyst posteriorly. Tender over the posterior lateral joint line. Some over the lateral joint line itself. Hip motion which is not the referable to any pain in his knee.  Specialty Comments:  No specialty comments available.  Imaging: Xr Knee Complete 4 Views Left  Result Date: 12/31/2016 4 view x-ray of the left knee revealed medial compartment narrowing by at least 50-75%. Does have peak intercondylar spines. The lateral femoral condyle does have some spurring both  laterally and at the intercondylar notch. The plateau may have some cystic changes on the lateral tibial plateau. Sclerosis of the medial tibial plateau was noted that with some periarticular spurring. Sunrise view does show more medial than lateral periarticular spurring. There is calcification of possible loose body in the lateral posterior aspect of the knee.    PMFS History: Patient Active Problem List   Diagnosis Date Noted  . Osteoarthritis of acromioclavicular joint 04/05/2013  . Impingement syndrome of right shoulder 04/05/2013   Past Medical History:  Diagnosis Date  . Arthritis   . Hyperlipemia   . Medical history non-contributory   . Wears glasses     No family history on file.  Past Surgical History:  Procedure Laterality Date  . COLONOSCOPY    . KNEE ARTHROSCOPY  9/12   left  . SHOULDER ARTHROSCOPY WITH OPEN ROTATOR CUFF REPAIR AND DISTAL CLAVICLE ACROMINECTOMY Right 04/05/2013   Procedure: SHOULDER ARTHROSCOPIC DEBRIDEMENT, ACROMIOPLASTY, DISTAL CLAVICLE RESECTION, POSSIBLE MINI OPEN ROTATOR CUFF REPAIR.  ;  Surgeon: Michael Balding, MD;  Location: [facility];  Service: Orthopedics;  Laterality: Right;  ARTHROSCOPIC DEBRIDEMENT SHOULDER, ACORMIOPLASTY, DISTAL CLAVICLE RESECTION, POSSIBLE MINI OPEN ROTATOR CUFF REPAIR.     Social History   Occupational History  . Not on file.   Social History Main Topics  . Smoking status: Never Smoker  . Smokeless tobacco: Never Used  . Alcohol use Yes     Comment: occ  . Drug use: No  . Sexual activity: Not on file

## 2017-01-22 ENCOUNTER — Ambulatory Visit
Admission: RE | Admit: 2017-01-22 | Discharge: 2017-01-22 | Disposition: A | Discharge status: Home / Self Care | Payer: 59 | Source: Ambulatory Visit | Attending: Orthopedic Surgery | Admitting: Orthopedic Surgery

## 2017-01-22 DIAGNOSIS — S83207A Unspecified tear of unspecified meniscus, current injury, left knee, initial encounter: Secondary | ICD-10-CM | POA: Diagnosis not present

## 2017-01-26 ENCOUNTER — Encounter (INDEPENDENT_AMBULATORY_CARE_PROVIDER_SITE_OTHER): Payer: Self-pay | Admitting: Orthopaedic Surgery

## 2017-01-26 ENCOUNTER — Ambulatory Visit (INDEPENDENT_AMBULATORY_CARE_PROVIDER_SITE_OTHER): Payer: 59 | Admitting: Orthopaedic Surgery

## 2017-01-26 VITALS — BP 126/72 | HR 70 | Resp 14 | Ht 68.0 in | Wt 216.0 lb

## 2017-01-26 DIAGNOSIS — G8929 Other chronic pain: Secondary | ICD-10-CM | POA: Diagnosis not present

## 2017-01-26 DIAGNOSIS — M25562 Pain in left knee: Secondary | ICD-10-CM | POA: Diagnosis not present

## 2017-01-26 MED ORDER — BUPIVACAINE HCL 0.5 % IJ SOLN
3.0000 mL | INTRAMUSCULAR | Status: AC | PRN
  Administered 2017-01-26: 3 mL via INTRA_ARTICULAR

## 2017-01-26 MED ORDER — METHYLPREDNISOLONE ACETATE 40 MG/ML IJ SUSP
80.0000 mg | INTRAMUSCULAR | Status: AC | PRN
  Administered 2017-01-26: 80 mg

## 2017-01-26 MED ORDER — LIDOCAINE HCL 1 % IJ SOLN
5.0000 mL | INTRAMUSCULAR | Status: AC | PRN
  Administered 2017-01-26: 5 mL

## 2017-01-26 NOTE — Progress Notes (Signed)
Office Visit Note   Patient: Michael [name]           Date of Birth: 1956/03/22           MRN: [id number] Visit Date: 01/26/2017              Requested by: Shirline Frees, MD [place], [place] [address] PCP: Shirline Frees, MD   Assessment & Plan: Visit Diagnoses:  1. Chronic pain of left knee     Plan: MRI scan performed 01/22/2017 and compared to MRI scan of the same knee from 2014 in 2016. There is a new horizontal tear in the mid body of the medial meniscus. There are also tricompartmental degenerative changes predominantly in the medial compartment where there is marked cartilage thinning it is not significantly changed over the past over years. There is progression of the degenerative changes at the patellofemoral joint. There also was a Baker's cyst measuring 1.8 cm x 1 cm x 5.6 cm which is axilla but smaller from than his prior films were long discussion regarding the findings. He certainly has evidence of 2 issues creating pain in the medial compartment of his knee i.e. the tear the meniscus and the arthritis. I think from a diagnostic and therapeutic standpoint is worth injecting the knee and monitoring his response's had a prior partial medial meniscectomy  Follow-Up Instructions: Return if symptoms worsen or fail to improve.   Orders:  No orders of the defined types were placed in this encounter.  No orders of the defined types were placed in this encounter.     Procedures: Large Joint Inj Date/Time: 01/26/2017 4:17 PM Performed by: Garald Balding Authorized by: Garald Balding   Consent Given by:  Patient Timeout: prior to procedure the correct patient, procedure, and site was verified   Indications:  Pain and joint swelling Location:  Knee Site:  L knee Prep: patient was prepped and draped in usual sterile fashion   Needle Size:  25 G Needle Length:  1.5 inches Approach:  Anteromedial Ultrasound Guidance: No   Fluoroscopic  Guidance: No   Arthrogram: No   Medications:  5 mL lidocaine 1 %; 80 mg methylPREDNISolone acetate 40 MG/ML; 3 mL bupivacaine 0.5 % Aspiration Attempted: No   Patient tolerance:  Patient tolerated the procedure well with no immediate complications     Clinical Data: No additional findings.   Subjective: Chief Complaint  Patient presents with  . Left Knee - Results, Pain    Michael [name] is a 61 y o that is here for MRI results of Left knee. Hx of L knee  arthroscopy yrs ago.  Per prior history. Present feeling a little bit better. Predominantly anterior pain and medial joint discomfort left knee  HPI  Review of Systems  Constitutional: Negative for fatigue.  HENT: Negative for hearing loss.   Respiratory: Negative for apnea, chest tightness and shortness of breath.   Cardiovascular: Negative for chest pain, palpitations and leg swelling.  Gastrointestinal: Negative for blood in stool, constipation and diarrhea.  Genitourinary: Negative for difficulty urinating.  Musculoskeletal: Positive for arthralgias, joint swelling, neck pain and neck stiffness. Negative for back pain.  Neurological: Negative for weakness, numbness and headaches.  Hematological: Does not bruise/bleed easily.  Psychiatric/Behavioral: Negative for sleep disturbance. The patient is not nervous/anxious.      Objective: Vital Signs: BP 126/72   Pulse 70   Resp 14   Ht 5\' 8"  (1.727 m)  Wt 216 lb (98 kg)   BMI 32.84 kg/m   Physical Exam  Ortho Exam awake alert and oriented 3 comfortable sitting. Predominantly medial joint pain left knee without effusion. Some patellar crepitation. No pain laterally. Full extension. Slight varus position.. Some fullness in the popliteal space consistent with his popliteal cyst. onThe MRI scan. No calf pain. No distal edema. Walks without a limp. Neurovascular exam intact.  Specialty Comments:  No specialty comments available.  Imaging: No results found.   PMFS  History: Patient Active Problem List   Diagnosis Date Noted  . Osteoarthritis of acromioclavicular joint 04/05/2013  . Impingement syndrome of right shoulder 04/05/2013   Past Medical History:  Diagnosis Date  . Arthritis   . Hyperlipemia   . Medical history non-contributory   . Wears glasses     No family history on file.  Past Surgical History:  Procedure Laterality Date  . COLONOSCOPY    . KNEE ARTHROSCOPY  9/12   left  . SHOULDER ARTHROSCOPY WITH OPEN ROTATOR CUFF REPAIR AND DISTAL CLAVICLE ACROMINECTOMY Right 04/05/2013   Procedure: SHOULDER ARTHROSCOPIC DEBRIDEMENT, ACROMIOPLASTY, DISTAL CLAVICLE RESECTION, POSSIBLE MINI OPEN ROTATOR CUFF REPAIR.  ;  Surgeon: Garald Balding, MD;  Location: [facility];  Service: Orthopedics;  Laterality: Right;  ARTHROSCOPIC DEBRIDEMENT SHOULDER, ACORMIOPLASTY, DISTAL CLAVICLE RESECTION, POSSIBLE MINI OPEN ROTATOR CUFF REPAIR.     Social History   Occupational History  . Not on file.   Social History Main Topics  . Smoking status: Never Smoker  . Smokeless tobacco: Never Used  . Alcohol use Yes     Comment: occ  . Drug use: No  . Sexual activity: Not on file

## 2017-03-10 ENCOUNTER — Encounter (INDEPENDENT_AMBULATORY_CARE_PROVIDER_SITE_OTHER): Payer: Self-pay | Admitting: Orthopaedic Surgery

## 2017-03-10 ENCOUNTER — Ambulatory Visit (INDEPENDENT_AMBULATORY_CARE_PROVIDER_SITE_OTHER): Payer: 59 | Admitting: Orthopaedic Surgery

## 2017-03-10 ENCOUNTER — Ambulatory Visit (INDEPENDENT_AMBULATORY_CARE_PROVIDER_SITE_OTHER): Payer: Self-pay

## 2017-03-10 DIAGNOSIS — M25561 Pain in right knee: Secondary | ICD-10-CM | POA: Diagnosis not present

## 2017-03-10 NOTE — Progress Notes (Signed)
Office Visit Note   Patient: Michael [name]           Date of Birth: [birth year]           MRN: [id number] Visit Date: 03/10/2017              Requested by: Shirline Frees, MD [place], [place] [address] PCP: Shirline Frees, MD   Assessment & Plan: Visit Diagnoses:  1. Acute pain of right knee     Plan: Probable mild osteoarthritis right knee with a recurrent effusion and stiffness. No clinical evidence of meniscal tearing and long discussion guarding options. He would like to try a pullover knee support that he will purchase  over-the-counter medicines. We can always consider Cortisone injection  Follow-Up Instructions: No Follow-up on file.   Orders:  Orders Placed This Encounter  Procedures  . XR KNEE 3 VIEW RIGHT   No orders of the defined types were placed in this encounter.     Procedures: No procedures performed   Clinical Data: No additional findings.   Subjective: No chief complaint on file. Michael [name] recently was evaluated for a  problem with his left knee. He had evidence of a small meniscal tear associated with tricompartmental degenerative changes. Cortisone injection was very helpful. In the interim over the past month or so  he started developing pain in his right knee. No prior diagnosis or intervention. Pain is localized more along the lateral than medial compartment of his knee. Oftentimes he feels that his knee is stiff and sore and sometimes even "swollen". Occasionally will have some calf discomfort with swelling. No referred pain from his groin or hip or low back  HPI  Review of Systems   Objective: Vital Signs: There were no vitals taken for this visit.  Physical Exam  Ortho Exam awake alert and oriented 3. Comfortable sitting. Some limp with weightbearing right knee. Very minimal effusion. Slight increased varus with weightbearing. No patella crepitation. Some popliteal fullness with small popliteal cyst. No swelling  distally. Neurovascular exam intact. No instability. No popping or clicking. No medial or lateral joint pain   Imaging: No results found.   PMFS History: Patient Active Problem List   Diagnosis Date Noted  . Osteoarthritis of acromioclavicular joint 04/05/2013  . Impingement syndrome of right shoulder 04/05/2013   Past Medical History:  Diagnosis Date  . Arthritis   . Hyperlipemia   . Medical history non-contributory   . Wears glasses     History reviewed. No pertinent family history.  Past Surgical History:  Procedure Laterality Date  . COLONOSCOPY    . KNEE ARTHROSCOPY  9/12   left  . SHOULDER ARTHROSCOPY WITH OPEN ROTATOR CUFF REPAIR AND DISTAL CLAVICLE ACROMINECTOMY Right 04/05/2013   Procedure: SHOULDER ARTHROSCOPIC DEBRIDEMENT, ACROMIOPLASTY, DISTAL CLAVICLE RESECTION, POSSIBLE MINI OPEN ROTATOR CUFF REPAIR.  ;  Surgeon: Garald Balding, MD;  Location: [facility];  Service: Orthopedics;  Laterality: Right;  ARTHROSCOPIC DEBRIDEMENT SHOULDER, ACORMIOPLASTY, DISTAL CLAVICLE RESECTION, POSSIBLE MINI OPEN ROTATOR CUFF REPAIR.     Social History   Occupational History  . Not on file  Tobacco Use  . Smoking status: Never Smoker  . Smokeless tobacco: Never Used  Substance and Sexual Activity  . Alcohol use: Yes    Comment: occ  . Drug use: No  . Sexual activity: Not on file     Garald Balding, MD   Note - This record has been created using Dragon  software.  Chart creation errors have been sought, but may not always  have been located. Such creation errors do not reflect on  the standard of medical care.

## 2017-04-02 ENCOUNTER — Ambulatory Visit (INDEPENDENT_AMBULATORY_CARE_PROVIDER_SITE_OTHER): Payer: 59 | Admitting: Orthopaedic Surgery

## 2017-04-02 ENCOUNTER — Encounter (INDEPENDENT_AMBULATORY_CARE_PROVIDER_SITE_OTHER): Payer: Self-pay | Admitting: Orthopaedic Surgery

## 2017-04-02 VITALS — BP 141/79 | HR 89 | Resp 14 | Ht 70.0 in | Wt 210.0 lb

## 2017-04-02 DIAGNOSIS — M25561 Pain in right knee: Secondary | ICD-10-CM | POA: Diagnosis not present

## 2017-04-02 MED ORDER — BUPIVACAINE HCL 0.5 % IJ SOLN
2.0000 mL | INTRAMUSCULAR | Status: AC | PRN
  Administered 2017-04-02: 2 mL via INTRA_ARTICULAR

## 2017-04-02 MED ORDER — LIDOCAINE HCL 1 % IJ SOLN
2.0000 mL | INTRAMUSCULAR | Status: AC | PRN
  Administered 2017-04-02: 2 mL

## 2017-04-02 MED ORDER — METHYLPREDNISOLONE ACETATE 40 MG/ML IJ SUSP
80.0000 mg | INTRAMUSCULAR | Status: AC | PRN
  Administered 2017-04-02: 80 mg

## 2017-04-02 NOTE — Progress Notes (Signed)
Office Visit Note   Patient: Michael [name]           Date of Birth: 1956-03-06           MRN: [id number] Visit Date: 04/02/2017              Requested by: Michael Frees, MD Michael [name], Michael [name] [id number] PCP: Michael Frees, MD   Assessment & Plan: Visit Diagnoses:  1. Acute pain of right knee     Plan: Recently seen for evaluation of left knee pain with a cortisone injection is feeling better. Over the last several days as had onset of right knee pain with swelling and difficulty ambulating. No fever or chills. No injury. Films from prior visit demonstrated essentially normal anatomy without evidence of ectopic calcification or obvious arthritis.. Will inject right knee today and monitor her response if not much better over the next 10-14 days would consider an MRI scan. Localized areas of tenderness  Follow-Up Instructions: Return if symptoms worsen or fail to improve.   Orders:  No orders of the defined types were placed in this encounter.  No orders of the defined types were placed in this encounter.     Procedures: Large Joint Inj: R knee on 04/02/2017 4:19 PM Indications: pain and diagnostic evaluation Details: 25 G 1.5 in needle, anteromedial approach  Arthrogram: No  Medications: 2 mL lidocaine 1 %; 2 mL bupivacaine 0.5 %; 80 mg methylPREDNISolone acetate 40 MG/ML Aspirate: 8 mL yellow and clear Procedure, treatment alternatives, risks and benefits explained, specific risks discussed. Consent was given by the patient. Immediately prior to procedure a time out was called to verify the correct patient, procedure, equipment, support staff and site/side marked as required. Patient was prepped and draped in the usual sterile fashion.       Clinical Data: No additional findings.   Subjective: Chief Complaint  Patient presents with  . Right Knee - Pain, Edema    [name] is a 62 y o here today for chronic Right pain. Pt was in for OV  03/10/17, gradually becoming worse over the week. He relates when he stands, R  knee pops, painful and swollen. No fever or chills. Pt does have Right calf pain.  Has been followed recently for problems referable to the left knee. Recent cortisone injection made a big difference. Within the last week as had recurrent episodes of right knee pain and swelling without injury or trauma. No history of fever or chills. Prior films of his right knee revealed normal anatomy without ectopic calcification or obvious arthritis  HPI  Review of Systems  Cardiovascular: Positive for leg swelling.  Neurological: Positive for weakness.     Objective: Vital Signs: BP (!) 141/79   Pulse 89   Resp 14   Ht 5\' 10"  (1.778 m)   Wt 210 lb (95.3 kg)   BMI 30.13 kg/m   Physical Exam  Ortho Exam awake alert and oriented 3. Comfortable sitting. Positive effusion right knee. Knee is tight in extension although it is fully extended and flexed about 100. A little bit of popliteal fullness. Some very mild posterior thigh and calf pain but no distal edema. Negative Homan. Calf and thigh pain see much better after the cortisone injection right knee  Specialty Comments:  No specialty comments available.  Imaging: No results found.   PMFS History: Patient Active Problem List   Diagnosis Date Noted  . Osteoarthritis of acromioclavicular joint 04/05/2013  .  Impingement syndrome of right shoulder 04/05/2013   Past Medical History:  Diagnosis Date  . Arthritis   . Hyperlipemia   . Medical history non-contributory   . Wears glasses     History reviewed. No pertinent family history.  Past Surgical History:  Procedure Laterality Date  . COLONOSCOPY    . KNEE ARTHROSCOPY  9/12   left  . SHOULDER ARTHROSCOPY WITH OPEN ROTATOR CUFF REPAIR AND DISTAL CLAVICLE ACROMINECTOMY Right 04/05/2013   Procedure: SHOULDER ARTHROSCOPIC DEBRIDEMENT, ACROMIOPLASTY, DISTAL CLAVICLE RESECTION, POSSIBLE MINI OPEN ROTATOR CUFF  REPAIR.  ;  Surgeon: Garald Balding, MD;  Location: [facility];  Service: Orthopedics;  Laterality: Right;  ARTHROSCOPIC DEBRIDEMENT SHOULDER, ACORMIOPLASTY, DISTAL CLAVICLE RESECTION, POSSIBLE MINI OPEN ROTATOR CUFF REPAIR.     Social History   Occupational History  . Not on file  Tobacco Use  . Smoking status: Never Smoker  . Smokeless tobacco: Never Used  Substance and Sexual Activity  . Alcohol use: Yes    Comment: occ  . Drug use: No  . Sexual activity: Not on file

## 2017-04-23 ENCOUNTER — Encounter (INDEPENDENT_AMBULATORY_CARE_PROVIDER_SITE_OTHER): Payer: Self-pay | Admitting: Orthopaedic Surgery

## 2017-04-23 ENCOUNTER — Ambulatory Visit (INDEPENDENT_AMBULATORY_CARE_PROVIDER_SITE_OTHER): Payer: 59 | Admitting: Orthopaedic Surgery

## 2017-04-23 VITALS — BP 128/76 | HR 76 | Resp 12 | Ht 69.0 in | Wt 200.0 lb

## 2017-04-23 DIAGNOSIS — G8929 Other chronic pain: Secondary | ICD-10-CM

## 2017-04-23 DIAGNOSIS — M25561 Pain in right knee: Secondary | ICD-10-CM | POA: Diagnosis not present

## 2017-04-23 NOTE — Progress Notes (Signed)
Office Visit Note   Patient: Michael [name]           Date of Birth: [birth year]           MRN: [id number] Visit Date: 04/23/2017              Requested by: Shirline Frees, MD [place], [place] [address] PCP: Shirline Frees, MD   Assessment & Plan: Visit Diagnoses:  1. Chronic pain of right knee     Plan: Persistent pain along the medial aspect right knee over a month. No significant relief with cortisone injection time and over-the-counter medicines. Pain is localized on the posterior corner of the medial joint. I think he may have a tear of the medial meniscus. I think an MRI scan is indicated at this point  Follow-Up Instructions: Return after MRI right knee.   Orders:  Orders Placed This Encounter  Procedures  . MR Knee Right w/o contrast   No orders of the defined types were placed in this encounter.     Procedures: No procedures performed   Clinical Data: No additional findings.   Subjective: Chief Complaint  Patient presents with  . Right Knee - Pain, Edema    [name] is a 62 y o here today for F/U of Right knee pain. He relates his knee hurts with certain movements.  [name] was seen about 3 weeks ago for evaluation of right knee pain. I injected his knee with cortisone. He notes that it provided some relief but he still is having tightness swelling recurrent effusion and pain predominantly along the posterior medial corner of his knee.it seems to be more localized now. No sensation of his knee giving way  HPI  Review of Systems   Objective: Vital Signs: BP 128/76   Pulse 76   Resp 12   Ht 5\' 9"  (1.753 m)   Wt 200 lb (90.7 kg)   BMI 29.53 kg/m   Physical Exam  Ortho Exam awake alert 3. Comfortable sitting. Positive effusion right knee with localized tenderness along the posterior medial joint line. No popping or clicking. No patella pain. No lateral joint pain. Full extension and flexed over 100 without instability. No  popliteal pain calf discomfort or distal edema. Neurovascular exam intact. Straight leg raise negative. Painless range of motion of right hip  Specialty Comments:  No specialty comments available.  Imaging: No results found.   PMFS History: Patient Active Problem List   Diagnosis Date Noted  . Osteoarthritis of acromioclavicular joint 04/05/2013  . Impingement syndrome of right shoulder 04/05/2013   Past Medical History:  Diagnosis Date  . Arthritis   . Hyperlipemia   . Medical history non-contributory   . Wears glasses     History reviewed. No pertinent family history.  Past Surgical History:  Procedure Laterality Date  . COLONOSCOPY    . KNEE ARTHROSCOPY  9/12   left  . SHOULDER ARTHROSCOPY WITH OPEN ROTATOR CUFF REPAIR AND DISTAL CLAVICLE ACROMINECTOMY Right 04/05/2013   Procedure: SHOULDER ARTHROSCOPIC DEBRIDEMENT, ACROMIOPLASTY, DISTAL CLAVICLE RESECTION, POSSIBLE MINI OPEN ROTATOR CUFF REPAIR.  ;  Surgeon: Garald Balding, MD;  Location: [facility];  Service: Orthopedics;  Laterality: Right;  ARTHROSCOPIC DEBRIDEMENT SHOULDER, ACORMIOPLASTY, DISTAL CLAVICLE RESECTION, POSSIBLE MINI OPEN ROTATOR CUFF REPAIR.     Social History   Occupational History  . Not on file  Tobacco Use  . Smoking status: Never Smoker  . Smokeless tobacco: Never Used  Substance and Sexual Activity  . Alcohol use: Yes    Comment: occ  . Drug use: No  . Sexual activity: Not on file     Garald Balding, MD   Note - This record has been created using [employer].  Chart creation errors have been sought, but may not always  have been located. Such creation errors do not reflect on  the standard of medical care.

## 2017-04-23 NOTE — Progress Notes (Deleted)
   Office Visit Note   Patient: Michael [name]           Date of Birth: 1955-07-04           MRN: [id number] Visit Date: 04/23/2017              Requested by: Shirline Frees, MD [place], [place] [address] PCP: Shirline Frees, MD   Assessment & Plan: Visit Diagnoses: No diagnosis found.  Plan: ***  Follow-Up Instructions: No Follow-up on file.   Orders:  No orders of the defined types were placed in this encounter.  No orders of the defined types were placed in this encounter.     Procedures: No procedures performed   Clinical Data: No additional findings.   Subjective: Chief Complaint  Patient presents with  . Right Knee - Pain, Edema    Michael [name] is a 62 y o here today for F/U of Right knee pain. He relates his knee hurts with certain movements.    HPI  Review of Systems   Objective: Vital Signs: BP 128/76   Pulse 76   Resp 12   Ht 5\' 9"  (1.753 m)   Wt 200 lb (90.7 kg)   BMI 29.53 kg/m   Physical Exam  Ortho Exam  Specialty Comments:  No specialty comments available.  Imaging: No results found.   PMFS History: Patient Active Problem List   Diagnosis Date Noted  . Osteoarthritis of acromioclavicular joint 04/05/2013  . Impingement syndrome of right shoulder 04/05/2013   Past Medical History:  Diagnosis Date  . Arthritis   . Hyperlipemia   . Medical history non-contributory   . Wears glasses     No family history on file.  Past Surgical History:  Procedure Laterality Date  . COLONOSCOPY    . KNEE ARTHROSCOPY  9/12   left  . SHOULDER ARTHROSCOPY WITH OPEN ROTATOR CUFF REPAIR AND DISTAL CLAVICLE ACROMINECTOMY Right 04/05/2013   Procedure: SHOULDER ARTHROSCOPIC DEBRIDEMENT, ACROMIOPLASTY, DISTAL CLAVICLE RESECTION, POSSIBLE MINI OPEN ROTATOR CUFF REPAIR.  ;  Surgeon: Garald Balding, MD;  Location: [facility];  Service: Orthopedics;  Laterality: Right;  ARTHROSCOPIC DEBRIDEMENT SHOULDER,  ACORMIOPLASTY, DISTAL CLAVICLE RESECTION, POSSIBLE MINI OPEN ROTATOR CUFF REPAIR.     Social History   Occupational History  . Not on file  Tobacco Use  . Smoking status: Never Smoker  . Smokeless tobacco: Never Used  Substance and Sexual Activity  . Alcohol use: Yes    Comment: occ  . Drug use: No  . Sexual activity: Not on file

## 2017-04-30 DIAGNOSIS — E7800 Pure hypercholesterolemia, unspecified: Secondary | ICD-10-CM | POA: Diagnosis not present

## 2017-04-30 DIAGNOSIS — Z0000 Encounter for general adult medical examination without abnormal findings: Secondary | ICD-10-CM | POA: Diagnosis not present

## 2017-04-30 DIAGNOSIS — Z1159 Encounter for screening for other viral diseases: Secondary | ICD-10-CM | POA: Diagnosis not present

## 2017-05-03 ENCOUNTER — Ambulatory Visit
Admission: RE | Admit: 2017-05-03 | Discharge: 2017-05-03 | Disposition: A | Discharge status: Home / Self Care | Payer: 59 | Source: Ambulatory Visit | Attending: Orthopaedic Surgery | Admitting: Orthopaedic Surgery

## 2017-05-03 DIAGNOSIS — G8929 Other chronic pain: Secondary | ICD-10-CM

## 2017-05-03 DIAGNOSIS — M23003 Cystic meniscus, unspecified medial meniscus, right knee: Secondary | ICD-10-CM | POA: Diagnosis not present

## 2017-05-03 DIAGNOSIS — M25561 Pain in right knee: Principal | ICD-10-CM

## 2017-05-07 ENCOUNTER — Encounter (INDEPENDENT_AMBULATORY_CARE_PROVIDER_SITE_OTHER): Payer: Self-pay | Admitting: Orthopaedic Surgery

## 2017-05-07 ENCOUNTER — Ambulatory Visit (INDEPENDENT_AMBULATORY_CARE_PROVIDER_SITE_OTHER): Payer: 59 | Admitting: Orthopaedic Surgery

## 2017-05-07 DIAGNOSIS — G8929 Other chronic pain: Secondary | ICD-10-CM | POA: Diagnosis not present

## 2017-05-07 DIAGNOSIS — M25561 Pain in right knee: Secondary | ICD-10-CM | POA: Diagnosis not present

## 2017-05-07 NOTE — Progress Notes (Signed)
Office Visit Note   Patient: Michael [name]           Date of Birth: 03-14-1956           MRN: [id number] Visit Date: 05/07/2017              Requested by: Shirline Frees, MD [address] [place], [place] [address] PCP: Shirline Frees, MD   Assessment & Plan: Visit Diagnoses:  1. Chronic pain of right knee     Plan: [place] had an MRI scan of his right knee demonstrating degenerative changes in both medial lateral compartment associated with a tear of the medial meniscus. He appears to be more symptomatic in the posterior medial corner of his knee consistent with a meniscal tear. We've had a long discussion regarding what he can expect over time and he like to proceed with a knee arthroscopy. He has similar findings on the left knee and had a successful knee arthroscopy  Follow-Up Instructions: Return will schedule surgery right knee.   Orders:  No orders of the defined types were placed in this encounter.  No orders of the defined types were placed in this encounter.     Procedures: No procedures performed   Clinical Data: No additional findings.   Subjective: No chief complaint on file. Michael [name] returns for evaluation of his right knee. He's had this chronic pain along the posterior medial corner of his knee that I think is been consistent with a tear of the medial meniscus. He did have an MRI scan that confirms a medial meniscal tear. He also has some degenerative changes in the medial lateral compartment. He's really not having much trouble laterally but continues to have some compromise of his activities.  HPI  Review of Systems   Objective: Vital Signs: There were no vitals taken for this visit.  Physical Exam  Ortho Exam awake alert and oriented 3. Comfortable sitting. No effusion right knee. Predominately medial joint pain and specifically in the posterior medial corner. No popping or clicking. Full extension and flexion over 105. No instability.  No popliteal pain. No calf pain. Neurovascular exam intact. Straight leg raise negative. Painless range of motion both hips  Specialty Comments:  No specialty comments available.  Imaging: No results found.   PMFS History: Patient Active Problem List   Diagnosis Date Noted  . Osteoarthritis of acromioclavicular joint 04/05/2013  . Impingement syndrome of right shoulder 04/05/2013   Past Medical History:  Diagnosis Date  . Arthritis   . Hyperlipemia   . Medical history non-contributory   . Wears glasses     History reviewed. No pertinent family history.  Past Surgical History:  Procedure Laterality Date  . COLONOSCOPY    . KNEE ARTHROSCOPY  9/12   left  . SHOULDER ARTHROSCOPY WITH OPEN ROTATOR CUFF REPAIR AND DISTAL CLAVICLE ACROMINECTOMY Right 04/05/2013   Procedure: SHOULDER ARTHROSCOPIC DEBRIDEMENT, ACROMIOPLASTY, DISTAL CLAVICLE RESECTION, POSSIBLE MINI OPEN ROTATOR CUFF REPAIR.  ;  Surgeon: Garald Balding, MD;  Location: [facility];  Service: Orthopedics;  Laterality: Right;  ARTHROSCOPIC DEBRIDEMENT SHOULDER, ACORMIOPLASTY, DISTAL CLAVICLE RESECTION, POSSIBLE MINI OPEN ROTATOR CUFF REPAIR.     Social History   Occupational History  . Not on file  Tobacco Use  . Smoking status: Never Smoker  . Smokeless tobacco: Never Used  Substance and Sexual Activity  . Alcohol use: Yes    Comment: occ  . Drug use: No  . Sexual activity: Not on file  Garald Balding, MD   Note - This record has been created using [employer].  Chart creation errors have been sought, but may not always  have been located. Such creation errors do not reflect on  the standard of medical care.

## 2017-05-08 ENCOUNTER — Ambulatory Visit (INDEPENDENT_AMBULATORY_CARE_PROVIDER_SITE_OTHER): Payer: 59 | Admitting: Orthopaedic Surgery

## 2017-05-11 ENCOUNTER — Ambulatory Visit (INDEPENDENT_AMBULATORY_CARE_PROVIDER_SITE_OTHER): Payer: 59 | Admitting: Orthopaedic Surgery

## 2017-05-14 DIAGNOSIS — M94261 Chondromalacia, right knee: Secondary | ICD-10-CM | POA: Diagnosis not present

## 2017-05-14 DIAGNOSIS — M948X6 Other specified disorders of cartilage, lower leg: Secondary | ICD-10-CM | POA: Diagnosis not present

## 2017-05-14 DIAGNOSIS — M23221 Derangement of posterior horn of medial meniscus due to old tear or injury, right knee: Secondary | ICD-10-CM | POA: Diagnosis not present

## 2017-05-18 ENCOUNTER — Ambulatory Visit (INDEPENDENT_AMBULATORY_CARE_PROVIDER_SITE_OTHER): Payer: 59 | Admitting: Orthopaedic Surgery

## 2017-05-18 ENCOUNTER — Encounter (INDEPENDENT_AMBULATORY_CARE_PROVIDER_SITE_OTHER): Payer: Self-pay | Admitting: Orthopaedic Surgery

## 2017-05-18 VITALS — BP 137/84 | HR 84 | Resp 16 | Ht 70.0 in | Wt 185.0 lb

## 2017-05-18 DIAGNOSIS — G8929 Other chronic pain: Secondary | ICD-10-CM

## 2017-05-18 DIAGNOSIS — M25561 Pain in right knee: Secondary | ICD-10-CM

## 2017-05-18 NOTE — Progress Notes (Signed)
Office Visit Note   Patient: Michael [name]           Date of Birth: 06/14/1955           MRN: [id number] Visit Date: 05/18/2017              Requested by: Shirline Frees, MD [address] [place], [place] [address] PCP: Shirline Frees, MD   Assessment & Plan: Visit Diagnoses:  1. Chronic pain of right knee     Plan: 4 days status post right knee arthroscopy. Tear medial meniscus. There were some areas of chondrocalcinosis crystals. Doing very well with minimal use of pain medicine. We'll urge him to continue with the exercises and return in 2 weeks  Follow-Up Instructions: Return in about 2 weeks (around 06/01/2017).   Orders:  No orders of the defined types were placed in this encounter.  No orders of the defined types were placed in this encounter.     Procedures: No procedures performed   Clinical Data: No additional findings.   Subjective: Chief Complaint  Patient presents with  . Right Knee - Routine Post Op    Michael [name] is a 62 y o S/P 4 days Right knee arthroscopy. Pt relates little to no pain. Pt went to [facility] to work out this am.  No related fever chills shortness of breath or chest pain. No calf pain. Is walking without ambulatory aid  HPI  Review of Systems  Constitutional: Negative for fatigue.  HENT: Negative for hearing loss.   Respiratory: Negative for apnea, chest tightness and shortness of breath.   Cardiovascular: Negative for chest pain, palpitations and leg swelling.  Gastrointestinal: Negative for blood in stool, constipation and diarrhea.  Genitourinary: Negative for difficulty urinating.  Musculoskeletal: Positive for joint swelling. Negative for arthralgias, back pain, myalgias, neck pain and neck stiffness.  Neurological: Negative for weakness, numbness and headaches.  Hematological: Does not bruise/bleed easily.  Psychiatric/Behavioral: Positive for sleep disturbance. The patient is not nervous/anxious.       Objective: Vital Signs: BP 137/84   Pulse 84   Resp 16   Ht 5\' 10"  (1.778 m)   Wt 185 lb (83.9 kg)   BMI 26.54 kg/m   Physical Exam  Ortho Exam incisions right knee healing without problem. No calf pain. Small effusion. Lacks just a few degrees to full extension lacks to over 100. No instability. Arthroscopic portals without drainage. Neurovascular exam intact  Specialty Comments:  No specialty comments available.  Imaging: No results found.   PMFS History: Patient Active Problem List   Diagnosis Date Noted  . Osteoarthritis of acromioclavicular joint 04/05/2013  . Impingement syndrome of right shoulder 04/05/2013   Past Medical History:  Diagnosis Date  . Arthritis   . Hyperlipemia   . Medical history non-contributory   . Wears glasses     History reviewed. No pertinent family history.  Past Surgical History:  Procedure Laterality Date  . COLONOSCOPY    . KNEE ARTHROSCOPY  9/12   left  . SHOULDER ARTHROSCOPY WITH OPEN ROTATOR CUFF REPAIR AND DISTAL CLAVICLE ACROMINECTOMY Right 04/05/2013   Procedure: SHOULDER ARTHROSCOPIC DEBRIDEMENT, ACROMIOPLASTY, DISTAL CLAVICLE RESECTION, POSSIBLE MINI OPEN ROTATOR CUFF REPAIR.  ;  Surgeon: Garald Balding, MD;  Location: [facility];  Service: Orthopedics;  Laterality: Right;  ARTHROSCOPIC DEBRIDEMENT SHOULDER, ACORMIOPLASTY, DISTAL CLAVICLE RESECTION, POSSIBLE MINI OPEN ROTATOR CUFF REPAIR.     Social History   Occupational History  . Not on  file  Tobacco Use  . Smoking status: Never Smoker  . Smokeless tobacco: Never Used  Substance and Sexual Activity  . Alcohol use: Yes    Comment: occ  . Drug use: No  . Sexual activity: Not on file

## 2017-06-01 ENCOUNTER — Encounter (INDEPENDENT_AMBULATORY_CARE_PROVIDER_SITE_OTHER): Payer: Self-pay | Admitting: Orthopaedic Surgery

## 2017-06-01 ENCOUNTER — Ambulatory Visit (INDEPENDENT_AMBULATORY_CARE_PROVIDER_SITE_OTHER): Payer: 59 | Admitting: Orthopaedic Surgery

## 2017-06-01 VITALS — BP 154/82 | HR 73 | Resp 16 | Ht 70.0 in | Wt 214.0 lb

## 2017-06-01 DIAGNOSIS — G8929 Other chronic pain: Secondary | ICD-10-CM

## 2017-06-01 DIAGNOSIS — M25561 Pain in right knee: Secondary | ICD-10-CM

## 2017-06-01 NOTE — Progress Notes (Signed)
Office Visit Note   Patient: Michael [name]           Date of Birth: 16-Oct-1955           MRN: [id number] Visit Date: 06/01/2017              Requested by: Shirline Frees, MD [place],  [id number] PCP: Shirline Frees, MD   Assessment & Plan: Visit Diagnoses:  1. Chronic pain of right knee     Plan: Nearly 3 weeks status post right knee arthroscopy. I performed a partial medial meniscectomy. There was evidence of chondrocalcinosis. Doing very well without any problems. We will urge exercises and return to see me as needed  Follow-Up Instructions: Return if symptoms worsen or fail to improve.   Orders:  No orders of the defined types were placed in this encounter.  No orders of the defined types were placed in this encounter.     Procedures: No procedures performed   Clinical Data: No additional findings.   Subjective: Chief Complaint  Patient presents with  . Right Knee - Follow-up  . Post-op Follow-up    right knee   2-1/2 weeks status post right knee arthroscopy for a tear of the medial meniscus. Chondrocalcinosis was identified at the time of surgery. No issues at present. No history of fever or chills shortness of breath chest pain or calf pain. Doing well without ambulatory aid  HPI  Review of Systems  Constitutional: Negative for fatigue.  HENT: Negative for ear pain and tinnitus.   Eyes: Negative for pain.  Respiratory: Negative for cough and shortness of breath.   Cardiovascular: Negative for chest pain, palpitations and leg swelling.  Gastrointestinal: Negative for blood in stool, constipation and diarrhea.  Genitourinary: Negative for dysuria.  Musculoskeletal: Negative for back pain and neck pain.  Skin: Negative for rash and wound.  Allergic/Immunologic: Negative for food allergies.  Neurological: Negative for weakness and numbness.  Psychiatric/Behavioral: The patient is not nervous/anxious.       Objective: Vital Signs: BP (!) 154/82 (BP Location: Right Arm, Patient Position: Sitting, Cuff Size: Normal)   Pulse 73   Resp 16   Ht 5\' 10"  (1.778 m)   Wt 214 lb (97.1 kg)   BMI 30.71 kg/m   Physical Exam  Ortho Exam awake alert and oriented 3. Comfortable sitting Minimal effusion right knee. No pain. Arthroscopic portals healed without problem. No calf pain. No popliteal fullness. Neurovascular exam intact. Walks without a limp. Prince of local tenderness or instability Specialty Comments:  No specialty comments available.  Imaging: No results found.   PMFS History: Patient Active Problem List   Diagnosis Date Noted  . Osteoarthritis of acromioclavicular joint 04/05/2013  . Impingement syndrome of right shoulder 04/05/2013   Past Medical History:  Diagnosis Date  . Arthritis   . Hyperlipemia   . Medical history non-contributory   . Wears glasses     History reviewed. No pertinent family history.  Past Surgical History:  Procedure Laterality Date  . COLONOSCOPY    . KNEE ARTHROSCOPY  9/12   left  . SHOULDER ARTHROSCOPY WITH OPEN ROTATOR CUFF REPAIR AND DISTAL CLAVICLE ACROMINECTOMY Right 04/05/2013   Procedure: SHOULDER ARTHROSCOPIC DEBRIDEMENT, ACROMIOPLASTY, DISTAL CLAVICLE RESECTION, POSSIBLE MINI OPEN ROTATOR CUFF REPAIR.  ;  Surgeon: Garald Balding, MD;  Location: [facility];  Service: Orthopedics;  Laterality: Right;  ARTHROSCOPIC DEBRIDEMENT SHOULDER, ACORMIOPLASTY, DISTAL CLAVICLE RESECTION, POSSIBLE MINI OPEN ROTATOR CUFF  REPAIR.     Social History   Occupational History  . Not on file  Tobacco Use  . Smoking status: Never Smoker  . Smokeless tobacco: Never Used  Substance and Sexual Activity  . Alcohol use: Yes    Comment: occ  . Drug use: No  . Sexual activity: Not on file

## 2017-06-29 ENCOUNTER — Encounter (INDEPENDENT_AMBULATORY_CARE_PROVIDER_SITE_OTHER): Payer: Self-pay | Admitting: Orthopaedic Surgery

## 2017-06-29 ENCOUNTER — Ambulatory Visit (INDEPENDENT_AMBULATORY_CARE_PROVIDER_SITE_OTHER): Payer: 59 | Admitting: Orthopaedic Surgery

## 2017-06-29 VITALS — BP 131/78 | HR 84 | Resp 16 | Ht 70.0 in | Wt 215.0 lb

## 2017-06-29 DIAGNOSIS — G8929 Other chronic pain: Secondary | ICD-10-CM

## 2017-06-29 DIAGNOSIS — M25562 Pain in left knee: Secondary | ICD-10-CM | POA: Diagnosis not present

## 2017-06-29 DIAGNOSIS — M25561 Pain in right knee: Secondary | ICD-10-CM

## 2017-06-29 MED ORDER — BUPIVACAINE HCL 0.5 % IJ SOLN
2.0000 mL | INTRAMUSCULAR | Status: AC | PRN
  Administered 2017-06-29: 2 mL via INTRA_ARTICULAR

## 2017-06-29 MED ORDER — METHYLPREDNISOLONE ACETATE 40 MG/ML IJ SUSP
80.0000 mg | INTRAMUSCULAR | Status: AC | PRN
  Administered 2017-06-29: 80 mg

## 2017-06-29 MED ORDER — LIDOCAINE HCL 1 % IJ SOLN
2.0000 mL | INTRAMUSCULAR | Status: AC | PRN
  Administered 2017-06-29: 2 mL

## 2017-06-29 NOTE — Progress Notes (Signed)
Office Visit Note   Patient: Michael [name]           Date of Birth: 1955-12-02           MRN: [id number] Visit Date: 06/29/2017              Requested by: Shirline Frees, MD [address] [place], [place] [address] PCP: Shirline Frees, MD   Assessment & Plan: Visit Diagnoses:  1. Chronic pain of both knees     Plan: 7 weeks status post right knee arthroscopy the tear of the medial meniscus.  Also noted to have areas of chondromalacia.  Actually doing well.  Also has some chronic problems with his left knee particularly along the lateral and posterior aspect of his knee.  Has had prior left knee arthroscopy with chondromalacia.  Will inject the left knee and monitor his response  Follow-Up Instructions: Return if symptoms worsen or fail to improve.   Orders:  No orders of the defined types were placed in this encounter.  No orders of the defined types were placed in this encounter.     Procedures: Large Joint Inj: L knee on 06/29/2017 9:00 AM Indications: pain and diagnostic evaluation Details: 25 G 1.5 in needle, anteromedial approach  Arthrogram: No  Medications: 2 mL lidocaine 1 %; 2 mL bupivacaine 0.5 %; 80 mg methylPREDNISolone acetate 40 MG/ML Procedure, treatment alternatives, risks and benefits explained, specific risks discussed. Consent was given by the patient. Patient was prepped and draped in the usual sterile fashion.       Clinical Data: No additional findings.   Subjective: Chief Complaint  Patient presents with  . Right Knee - Pain  . Left Knee - Pain  . Knee Pain    Right knee pain since surgery 05/14/17, catching, popping, clicking, no swelling, difficulty walking at times, difficulty sleeping at night, IBU helps, no inhjury , pain under patella  . Knee Pain    Left knee pain x years, lateral, 06/09/15 stepped through ceilling, arthroscopic x 5 + years, swelling at times, popping at times, limited range of motion, pain with standing,  difficulty walking at times, difficulty sleeping at night  Works out at [employer] 3 times a week.  Doing relatively well in terms of his right knee now 7 weeks postop from a knee arthroscopy and partial medial meniscectomy.  Having occasional pain along the lateral aspect of his left knee.  Prior arthroscopy years ago with evidence of chondromalacia.  No sensation of his knee giving way.  Does have some pain with bending stooping and squatting  HPI  Review of Systems  Constitutional: Positive for activity change and fatigue.  HENT: Negative for trouble swallowing.   Eyes: Negative for pain.  Respiratory: Negative for shortness of breath.   Cardiovascular: Positive for leg swelling.  Gastrointestinal: Negative for constipation.  Endocrine: Negative for cold intolerance.  Genitourinary: Negative for difficulty urinating.  Musculoskeletal: Positive for joint swelling.  Skin: Negative for rash.  Allergic/Immunologic: Negative for food allergies.  Neurological: Positive for weakness.  Psychiatric/Behavioral: Positive for sleep disturbance.     Objective: Vital Signs: BP 131/78 (BP Location: Left Arm, Patient Position: Sitting, Cuff Size: Normal)   Pulse 84   Resp 16   Ht 5\' 10"  (1.778 m)   Wt 215 lb (97.5 kg)   BMI 30.85 kg/m   Physical Exam  Ortho Exam awake alert and oriented x3.  Comfortable sitting.  Full range of motion both knee in  flexion extension.  No instability.  No effusion either knee.  Mild anterior medial joint pain right knee.  No popliteal discomfort.  No swelling neurovascular exam intact.  Mild lateral joint pain left knee.  With hyperflexion experiences some pain along the lateral hamstring tendon but no masses.  Specialty Comments:  No specialty comments available.  Imaging: No results found.   PMFS History: Patient Active Problem List   Diagnosis Date Noted  . Osteoarthritis of acromioclavicular joint 04/05/2013  . Impingement syndrome of right shoulder  04/05/2013   Past Medical History:  Diagnosis Date  . Arthritis   . Hyperlipemia   . Medical history non-contributory   . Wears glasses     History reviewed. No pertinent family history.  Past Surgical History:  Procedure Laterality Date  . COLONOSCOPY    . KNEE ARTHROSCOPY  9/12   left  . KNEE ARTHROSCOPY    . SHOULDER ARTHROSCOPY    . SHOULDER ARTHROSCOPY WITH OPEN ROTATOR CUFF REPAIR AND DISTAL CLAVICLE ACROMINECTOMY Right 04/05/2013   Procedure: SHOULDER ARTHROSCOPIC DEBRIDEMENT, ACROMIOPLASTY, DISTAL CLAVICLE RESECTION, POSSIBLE MINI OPEN ROTATOR CUFF REPAIR.  ;  Surgeon: Garald Balding, MD;  Location: [facility];  Service: Orthopedics;  Laterality: Right;  ARTHROSCOPIC DEBRIDEMENT SHOULDER, ACORMIOPLASTY, DISTAL CLAVICLE RESECTION, POSSIBLE MINI OPEN ROTATOR CUFF REPAIR.     Social History   Occupational History  . Not on file  Tobacco Use  . Smoking status: Never Smoker  . Smokeless tobacco: Never Used  Substance and Sexual Activity  . Alcohol use: Yes    Comment: occ  . Drug use: No  . Sexual activity: Not on file

## 2017-12-08 ENCOUNTER — Ambulatory Visit (INDEPENDENT_AMBULATORY_CARE_PROVIDER_SITE_OTHER): Payer: 59 | Admitting: Orthopaedic Surgery

## 2017-12-08 ENCOUNTER — Encounter (INDEPENDENT_AMBULATORY_CARE_PROVIDER_SITE_OTHER): Payer: Self-pay | Admitting: Orthopaedic Surgery

## 2017-12-08 VITALS — BP 149/81 | HR 81 | Resp 16 | Ht 70.0 in | Wt 212.0 lb

## 2017-12-08 DIAGNOSIS — G8929 Other chronic pain: Secondary | ICD-10-CM | POA: Diagnosis not present

## 2017-12-08 DIAGNOSIS — M25562 Pain in left knee: Secondary | ICD-10-CM

## 2017-12-08 MED ORDER — METHYLPREDNISOLONE ACETATE 40 MG/ML IJ SUSP
80.0000 mg | INTRAMUSCULAR | Status: AC | PRN
  Administered 2017-12-08: 80 mg

## 2017-12-08 MED ORDER — BUPIVACAINE HCL 0.5 % IJ SOLN
2.0000 mL | INTRAMUSCULAR | Status: AC | PRN
  Administered 2017-12-08: 2 mL via INTRA_ARTICULAR

## 2017-12-08 MED ORDER — LIDOCAINE HCL 1 % IJ SOLN
2.0000 mL | INTRAMUSCULAR | Status: AC | PRN
  Administered 2017-12-08: 2 mL

## 2017-12-08 NOTE — Progress Notes (Signed)
Office Visit Note   Patient: Michael [name]           Date of Birth: 10-05-1955           MRN: [id number] Visit Date: 12/08/2017              Requested by: Shirline Frees, MD [address] [place], [place] [address] PCP: Shirline Frees, MD   Assessment & Plan: Visit Diagnoses:  1. Chronic pain of left knee     Plan: Knee pain presumably on the basis of the osteoarthritis.  Will inject with cortisone and monitor response.  Long discussion regarding possible treatment options over time including Visco supplementation.  We will consider further films of his knee if no improvement  Follow-Up Instructions: Return if symptoms worsen or fail to improve.   Orders:  Orders Placed This Encounter  Procedures  . Large Joint Inj: L knee   No orders of the defined types were placed in this encounter.     Procedures: Large Joint Inj: L knee on 12/08/2017 3:56 PM Indications: pain and diagnostic evaluation Details: 25 G 1.5 in needle, anteromedial approach  Arthrogram: No  Medications: 2 mL lidocaine 1 %; 2 mL bupivacaine 0.5 %; 80 mg methylPREDNISolone acetate 40 MG/ML Procedure, treatment alternatives, risks and benefits explained, specific risks discussed. Consent was given by the patient. Patient was prepped and draped in the usual sterile fashion.       Clinical Data: No additional findings.   Subjective: Chief Complaint  Patient presents with  . Left Knee - Pain  . Knee Pain    Left knee pain x 3 weeks, no new injury, arthroscopic, popping, clicking, grinding noises, difficulty sleeping, cramps, difficulty getting up, weakness, tenderness, IBU helps, not diabetic  Kathryn has had recurrent problems with his left knee over long period of 2013 he underwent a knee arthroscopy for a tear of the medial meniscus.  He had mild tricompartmental degenerative changes at that time.  He is had some pain off and on with relatively good response using NSAIDs and an occasional  cortisone injection.  Recently he had an exacerbation without injury or trauma.  He said some difficulty with flexion and deep knee bends and squats.  He is not sure is had any effusion.  Has not had any back pain or groin discomfort.  He did have an MRI scan of his left knee and October 2018.  There was a possible new horizontal tear in the body of the medial meniscus.  There was marked progression of mucoid degeneration of the anterior cruciate ligament and medial and patellofemoral osteoarthritis that had progressed since his prior evaluations.  HPI  Review of Systems  Constitutional: Positive for fatigue.  HENT: Negative for trouble swallowing.   Eyes: Negative for pain.  Respiratory: Negative for shortness of breath.   Cardiovascular: Negative for leg swelling.  Gastrointestinal: Negative for constipation.  Endocrine: Negative for cold intolerance.  Genitourinary: Negative for difficulty urinating.  Musculoskeletal: Negative for gait problem.  Skin: Negative for rash.  Allergic/Immunologic: Negative for food allergies.  Neurological: Positive for weakness.  Hematological: Does not bruise/bleed easily.  Psychiatric/Behavioral: Positive for sleep disturbance.     Objective: Vital Signs: BP (!) 149/81 (BP Location: Right Arm, Patient Position: Sitting, Cuff Size: Normal)   Pulse 81   Resp 16   Ht 5\' 10"  (1.778 m)   Wt 212 lb (96.2 kg)   BMI 30.42 kg/m   Physical Exam  Constitutional: He  is oriented to person, place, and time. He appears well-developed and well-nourished.  HENT:  Mouth/Throat: Oropharynx is clear and moist.  Eyes: Pupils are equal, round, and reactive to light. EOM are normal.  Pulmonary/Chest: Effort normal.  Neurological: He is alert and oriented to person, place, and time.  Skin: Skin is warm and dry.  Psychiatric: He has a normal mood and affect. His behavior is normal.    Ortho Exam left knee without effusion.  Predominant medial joint pain with slight  varus.  Some patellar crepitation but no pain with compression.  Mild lateral joint pain without popping or clicking.  No popliteal pain.  Full extension flexion over 100 degrees.  No instability.  No calf pain.  No distal edema.  Neurovascular exam intact  Specialty Comments:  No specialty comments available.  Imaging: No results found.   PMFS History: Patient Active Problem List   Diagnosis Date Noted  . Osteoarthritis of acromioclavicular joint 04/05/2013  . Impingement syndrome of right shoulder 04/05/2013   Past Medical History:  Diagnosis Date  . Arthritis   . Hyperlipemia   . Medical history non-contributory   . Wears glasses     History reviewed. No pertinent family history.  Past Surgical History:  Procedure Laterality Date  . COLONOSCOPY    . KNEE ARTHROSCOPY  9/12   left  . KNEE ARTHROSCOPY    . SHOULDER ARTHROSCOPY    . SHOULDER ARTHROSCOPY WITH OPEN ROTATOR CUFF REPAIR AND DISTAL CLAVICLE ACROMINECTOMY Right 04/05/2013   Procedure: SHOULDER ARTHROSCOPIC DEBRIDEMENT, ACROMIOPLASTY, DISTAL CLAVICLE RESECTION, POSSIBLE MINI OPEN ROTATOR CUFF REPAIR.  ;  Surgeon: Garald Balding, MD;  Location: [facility];  Service: Orthopedics;  Laterality: Right;  ARTHROSCOPIC DEBRIDEMENT SHOULDER, ACORMIOPLASTY, DISTAL CLAVICLE RESECTION, POSSIBLE MINI OPEN ROTATOR CUFF REPAIR.     Social History   Occupational History  . Not on file  Tobacco Use  . Smoking status: Never Smoker  . Smokeless tobacco: Never Used  Substance and Sexual Activity  . Alcohol use: Yes    Comment: occ  . Drug use: No  . Sexual activity: Not on file

## 2018-02-11 DIAGNOSIS — L814 Other melanin hyperpigmentation: Secondary | ICD-10-CM | POA: Diagnosis not present

## 2018-02-11 DIAGNOSIS — D225 Melanocytic nevi of trunk: Secondary | ICD-10-CM | POA: Diagnosis not present

## 2018-02-11 DIAGNOSIS — L821 Other seborrheic keratosis: Secondary | ICD-10-CM | POA: Diagnosis not present

## 2018-02-11 DIAGNOSIS — L820 Inflamed seborrheic keratosis: Secondary | ICD-10-CM | POA: Diagnosis not present

## 2018-04-22 DIAGNOSIS — L299 Pruritus, unspecified: Secondary | ICD-10-CM | POA: Diagnosis not present

## 2018-04-22 DIAGNOSIS — R3989 Other symptoms and signs involving the genitourinary system: Secondary | ICD-10-CM | POA: Diagnosis not present

## 2018-04-22 DIAGNOSIS — R5383 Other fatigue: Secondary | ICD-10-CM | POA: Diagnosis not present

## 2018-05-14 DIAGNOSIS — K640 First degree hemorrhoids: Secondary | ICD-10-CM | POA: Diagnosis not present

## 2018-05-14 DIAGNOSIS — Z1211 Encounter for screening for malignant neoplasm of colon: Secondary | ICD-10-CM | POA: Diagnosis not present

## 2018-05-24 ENCOUNTER — Encounter (INDEPENDENT_AMBULATORY_CARE_PROVIDER_SITE_OTHER): Payer: Self-pay | Admitting: Orthopaedic Surgery

## 2018-05-24 ENCOUNTER — Ambulatory Visit (INDEPENDENT_AMBULATORY_CARE_PROVIDER_SITE_OTHER): Payer: 59

## 2018-05-24 ENCOUNTER — Telehealth: Payer: Self-pay | Admitting: *Deleted

## 2018-05-24 ENCOUNTER — Other Ambulatory Visit: Payer: Self-pay | Admitting: *Deleted

## 2018-05-24 ENCOUNTER — Ambulatory Visit (INDEPENDENT_AMBULATORY_CARE_PROVIDER_SITE_OTHER): Payer: 59 | Admitting: Orthopaedic Surgery

## 2018-05-24 VITALS — BP 146/89 | HR 97 | Ht 69.5 in | Wt 212.0 lb

## 2018-05-24 DIAGNOSIS — G8929 Other chronic pain: Secondary | ICD-10-CM | POA: Diagnosis not present

## 2018-05-24 DIAGNOSIS — M25551 Pain in right hip: Secondary | ICD-10-CM

## 2018-05-24 DIAGNOSIS — M25562 Pain in left knee: Secondary | ICD-10-CM

## 2018-05-24 DIAGNOSIS — M5441 Lumbago with sciatica, right side: Principal | ICD-10-CM

## 2018-05-24 MED ORDER — BUPIVACAINE HCL 0.5 % IJ SOLN
2.0000 mL | INTRAMUSCULAR | Status: AC | PRN
  Administered 2018-05-24: 2 mL via INTRA_ARTICULAR

## 2018-05-24 MED ORDER — LIDOCAINE HCL 1 % IJ SOLN
2.0000 mL | INTRAMUSCULAR | Status: AC | PRN
  Administered 2018-05-24: 2 mL

## 2018-05-24 MED ORDER — METHYLPREDNISOLONE ACETATE 40 MG/ML IJ SUSP
80.0000 mg | INTRAMUSCULAR | Status: AC | PRN
  Administered 2018-05-24: 80 mg via INTRA_ARTICULAR

## 2018-05-24 NOTE — Progress Notes (Signed)
Office Visit Note   Patient: Michael [name]           Date of Birth: [birth year]           MRN: [id number] Visit Date: 05/24/2018              Requested by: Shirline Frees, MD [address], [place] [address] PCP: Shirline Frees, MD   Assessment & Plan: Visit Diagnoses:  1. Chronic pain of left knee   2. Pain in right hip     Plan: Progressive osteoarthritis left knee.  Long discussion regarding treatment options.  Will inject with cortisone and pre-CERT Visco supplementation.  Also appears to have degenerative arthritis of the lumbar spine.  We will set up a course of physical therapy and monitor  Follow-Up Instructions: Return pre cert visco.   Orders:  Orders Placed This Encounter  Procedures  . Large Joint Inj: L knee  . XR KNEE 3 VIEW LEFT  . XR HIP UNILAT W OR W/O PELVIS 2-3 VIEWS RIGHT   No orders of the defined types were placed in this encounter.     Procedures: Large Joint Inj: L knee on 05/24/2018 4:03 PM Indications: pain and diagnostic evaluation Details: 25 G 1.5 in needle, anteromedial approach  Arthrogram: No  Medications: 2 mL lidocaine 1 %; 2 mL bupivacaine 0.5 %; 80 mg methylPREDNISolone acetate 40 MG/ML Procedure, treatment alternatives, risks and benefits explained, specific risks discussed. Consent was given by the patient. Patient was prepped and draped in the usual sterile fashion.       Clinical Data: No additional findings.   Subjective: Chief Complaint  Patient presents with  . Left Knee - Pain  Patient presents today with left knee pain. He said that it has been hurting for around 55months. He said that it hurts on the lateral side and has limited range of motion. He said the pain wakes him at night. He takes Ibuprofen as needed. He has grinding and weakness in his left knee. He was last here in September of 2019 and received a cortisone injection which did help for awhile.  He also mentioned that his right hip has  been hurting for a couple months. He said that the pain is not constant, but seems to bother him on long car rides or if he does prolonged walking. The pain is located on the lateral side of his hip. The hip pain does not bother him at night.  No related groin pain or radicular discomfort.  Pain is in the paralumbar region more to the right than to the left  HPI  Review of Systems  Constitutional: Negative for fatigue.  HENT: Negative for ear pain.   Eyes: Negative for pain.  Respiratory: Negative for shortness of breath.   Cardiovascular: Negative for leg swelling.  Gastrointestinal: Negative for constipation and diarrhea.  Endocrine: Negative for cold intolerance and heat intolerance.  Genitourinary: Negative for difficulty urinating.  Musculoskeletal: Negative for joint swelling.  Skin: Negative for rash.  Allergic/Immunologic: Negative for food allergies.  Neurological: Positive for weakness.  Hematological: Does not bruise/bleed easily.  Psychiatric/Behavioral: Positive for sleep disturbance.     Objective: Vital Signs: BP (!) 146/89   Pulse 97   Ht 5' 9.5" (1.765 m)   Wt 212 lb (96.2 kg)   BMI 30.86 kg/m   Physical Exam Constitutional:      Appearance: He is well-developed.  Eyes:     Pupils: Pupils are equal, round,  and reactive to light.  Pulmonary:     Effort: Pulmonary effort is normal.  Skin:    General: Skin is warm and dry.  Neurological:     Mental Status: He is alert and oriented to person, place, and time.  Psychiatric:        Behavior: Behavior normal.     Ortho Exam awake alert and oriented x3.  Comfortable sitting.  Lacks a few degrees to full extension left knee.  No effusion.  Mostly medial joint pain.  Flexed over 105 degrees.  Some patella femoral crepitation and pain with compression.  No lateral joint pain.  No distal edema.  Neurovascular exam intact.  Straight leg raise negative bilaterally.  No pain with range of motion of either hip with  some mild percussible tenderness in the right paralumbar region  Specialty Comments:  No specialty comments available.  Imaging: Xr Hip Unilat W Or W/o Pelvis 2-3 Views Right  Result Date: 05/24/2018 P the pelvis demonstrates intact hip joints.  No obvious narrowing.  No acute changes.  There is considerable facet arthropathy on that single AP view and L5-S1 particularly on the right.  Sacroiliac joints intact    PMFS History: Patient Active Problem List   Diagnosis Date Noted  . Pain in right hip 05/24/2018  . Chronic pain of left knee 05/24/2018  . Osteoarthritis of acromioclavicular joint 04/05/2013  . Impingement syndrome of right shoulder 04/05/2013   Past Medical History:  Diagnosis Date  . Arthritis   . Hyperlipemia   . Medical history non-contributory   . Wears glasses     History reviewed. No pertinent family history.  Past Surgical History:  Procedure Laterality Date  . COLONOSCOPY    . KNEE ARTHROSCOPY  9/12   left  . KNEE ARTHROSCOPY    . SHOULDER ARTHROSCOPY    . SHOULDER ARTHROSCOPY WITH OPEN ROTATOR CUFF REPAIR AND DISTAL CLAVICLE ACROMINECTOMY Right 04/05/2013   Procedure: SHOULDER ARTHROSCOPIC DEBRIDEMENT, ACROMIOPLASTY, DISTAL CLAVICLE RESECTION, POSSIBLE MINI OPEN ROTATOR CUFF REPAIR.  ;  Surgeon: Garald Balding, MD;  Location: [facility];  Service: Orthopedics;  Laterality: Right;  ARTHROSCOPIC DEBRIDEMENT SHOULDER, ACORMIOPLASTY, DISTAL CLAVICLE RESECTION, POSSIBLE MINI OPEN ROTATOR CUFF REPAIR.     Social History   Occupational History  . Not on file  Tobacco Use  . Smoking status: Never Smoker  . Smokeless tobacco: Never Used  Substance and Sexual Activity  . Alcohol use: Yes    Comment: occ  . Drug use: No  . Sexual activity: Not on file

## 2018-05-24 NOTE — Telephone Encounter (Signed)
Please apply for visco for left knee for Dr. Durward Fortes patient. Thank you.

## 2018-05-25 NOTE — Telephone Encounter (Signed)
Noted  

## 2018-05-28 ENCOUNTER — Telehealth (INDEPENDENT_AMBULATORY_CARE_PROVIDER_SITE_OTHER): Payer: Self-pay | Admitting: Orthopaedic Surgery

## 2018-05-28 DIAGNOSIS — Z0000 Encounter for general adult medical examination without abnormal findings: Secondary | ICD-10-CM | POA: Diagnosis not present

## 2018-05-28 NOTE — Telephone Encounter (Signed)
PT needs rx, and insurance card faxed over FAX# [phone]. Patient has not been there for several years for physical therapy.

## 2018-05-28 NOTE — Telephone Encounter (Signed)
Referral, insurance card and demo refaxed, pending appt

## 2018-05-31 ENCOUNTER — Telehealth (INDEPENDENT_AMBULATORY_CARE_PROVIDER_SITE_OTHER): Payer: Self-pay | Admitting: Orthopaedic Surgery

## 2018-05-31 DIAGNOSIS — M6281 Muscle weakness (generalized): Secondary | ICD-10-CM | POA: Diagnosis not present

## 2018-05-31 DIAGNOSIS — M5441 Lumbago with sciatica, right side: Secondary | ICD-10-CM | POA: Diagnosis not present

## 2018-05-31 DIAGNOSIS — M25551 Pain in right hip: Secondary | ICD-10-CM | POA: Diagnosis not present

## 2018-05-31 NOTE — Telephone Encounter (Signed)
refaxed referral and last office visit note to number below.

## 2018-05-31 NOTE — Telephone Encounter (Signed)
Beverlee Nims from [employer] called stating patient is at physical therapy and they have not received the PT order.  Beverlee Nims states they only received patient's insurance information.  Please fax orders to [phone]

## 2018-06-01 ENCOUNTER — Telehealth (INDEPENDENT_AMBULATORY_CARE_PROVIDER_SITE_OTHER): Payer: Self-pay

## 2018-06-01 NOTE — Telephone Encounter (Signed)
Submitted VOB for Euflexxa, left knee.

## 2018-06-02 ENCOUNTER — Telehealth (INDEPENDENT_AMBULATORY_CARE_PROVIDER_SITE_OTHER): Payer: Self-pay

## 2018-06-02 NOTE — Telephone Encounter (Signed)
Please schedule patient an appointment with Dr. Durward Fortes for gel injection.  Thank You.  Approved for Euflexxa series, left knee. [employer] & Bill Covered at 100% of the allowable amount. No Co-pay No PA required

## 2018-06-07 DIAGNOSIS — E7800 Pure hypercholesterolemia, unspecified: Secondary | ICD-10-CM | POA: Diagnosis not present

## 2018-06-11 DIAGNOSIS — M5441 Lumbago with sciatica, right side: Secondary | ICD-10-CM | POA: Diagnosis not present

## 2018-06-11 DIAGNOSIS — M6281 Muscle weakness (generalized): Secondary | ICD-10-CM | POA: Diagnosis not present

## 2018-06-11 DIAGNOSIS — M25551 Pain in right hip: Secondary | ICD-10-CM | POA: Diagnosis not present

## 2018-08-09 IMAGING — MR MR KNEE*R* W/O CM
4 of 6 series · 19 of 40 positions shown · non-contrast
Comparison: None.

CLINICAL DATA: Chronic medial knee pain.

EXAM:
MRI OF THE RIGHT KNEE WITHOUT CONTRAST
TECHNIQUE: Multiplanar, multisequence MR imaging of the knee was performed. No
intravenous contrast was administered.

[Series 3: PD fat-sat · axial · 4.0mm · 0.31mm/px · z∈[-53,+67]mm · 8 of 27 slices shown (1 of 4)]
[im 1/27]
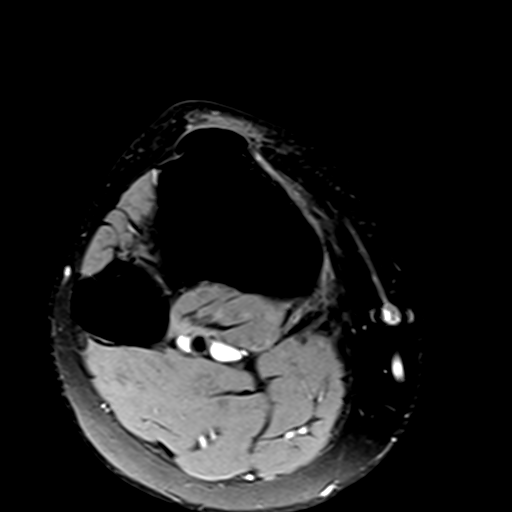
[im 4/27]
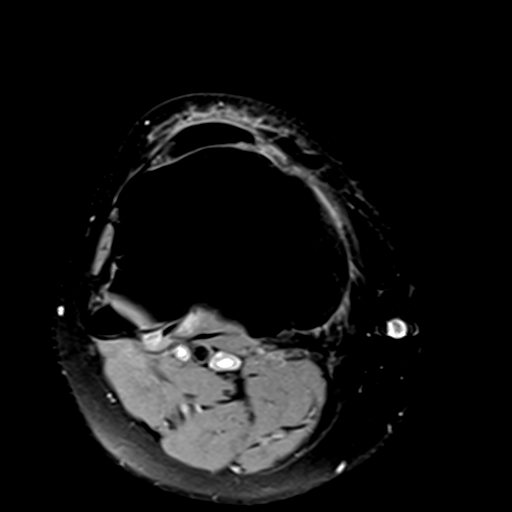
[im 8/27]
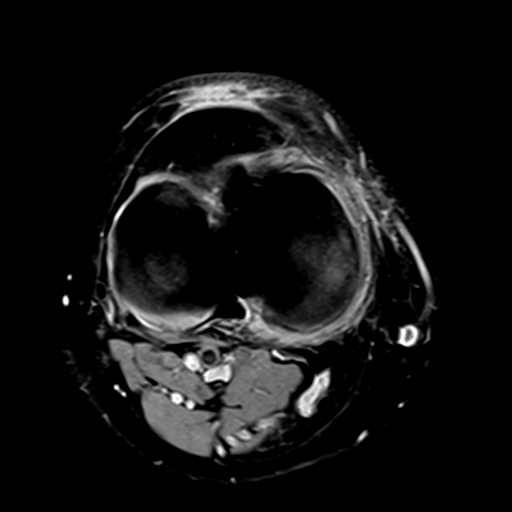
[im 12/27]
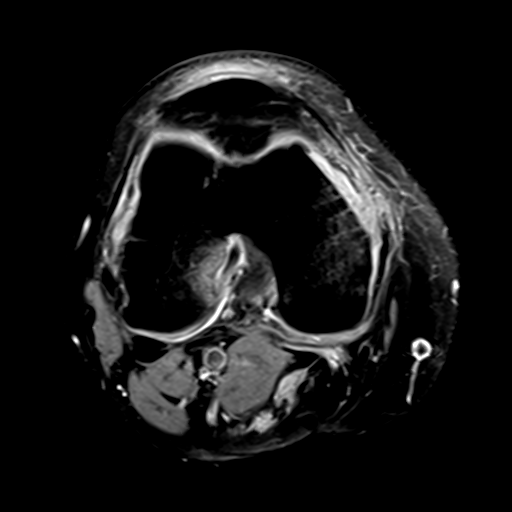
[im 15/27]
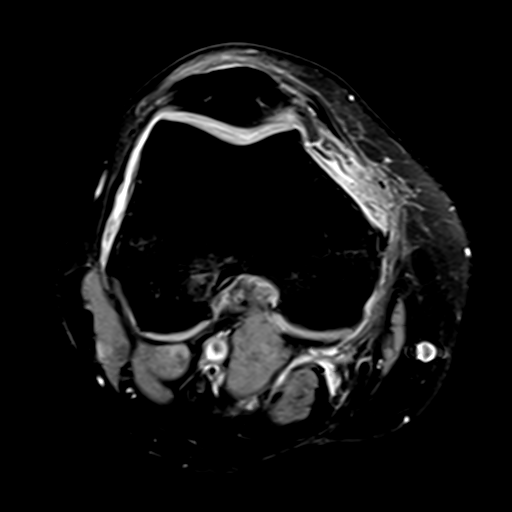
[im 19/27]
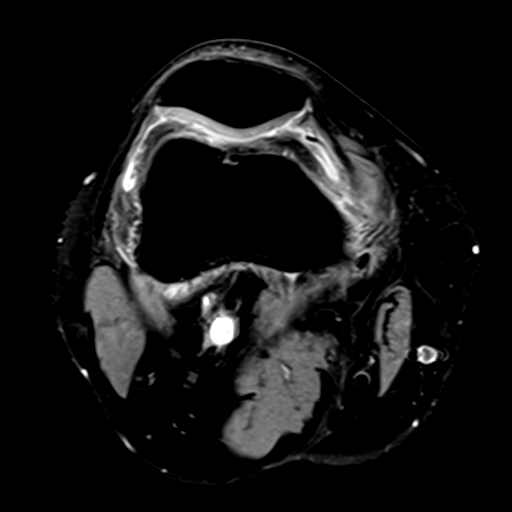
[im 23/27]
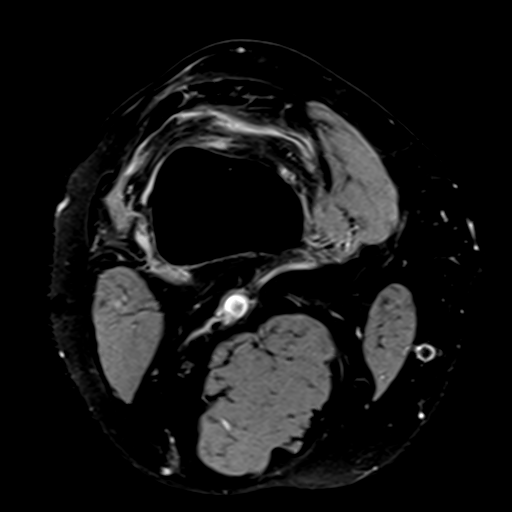
[im 27/27]
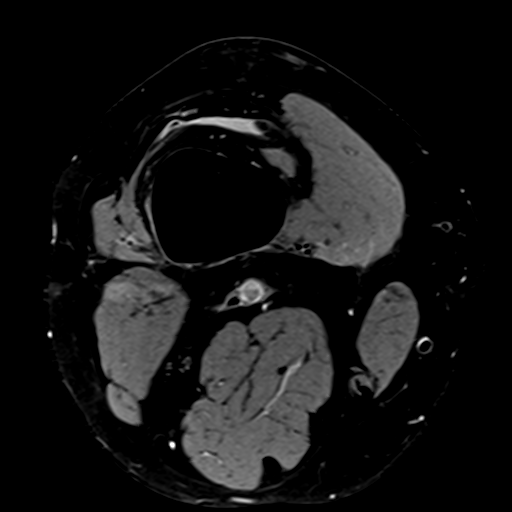

[Series 6: PD fat-sat · sagittal · 3.3mm · 0.31mm/px · 5 of 25 slices shown (2 of 4)]
[im 1/25]
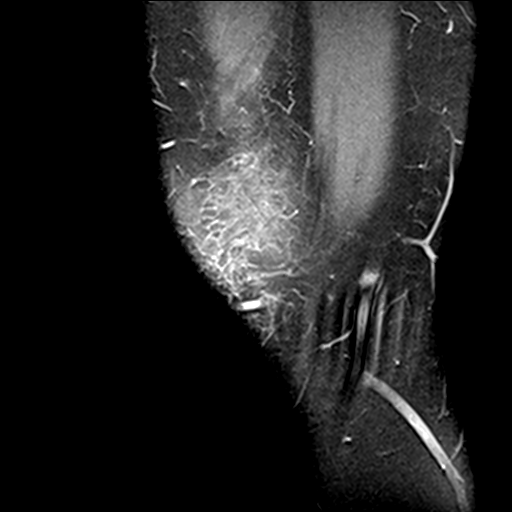
[im 5/25]
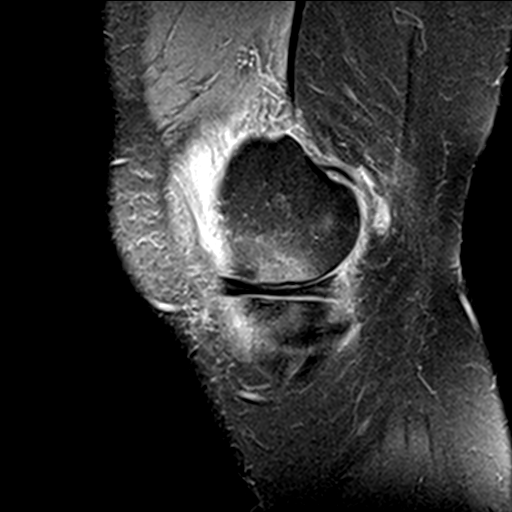
[im 9/25]
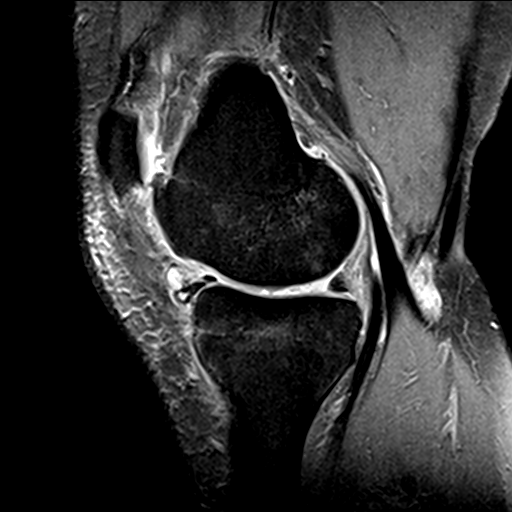
[im 13/25]
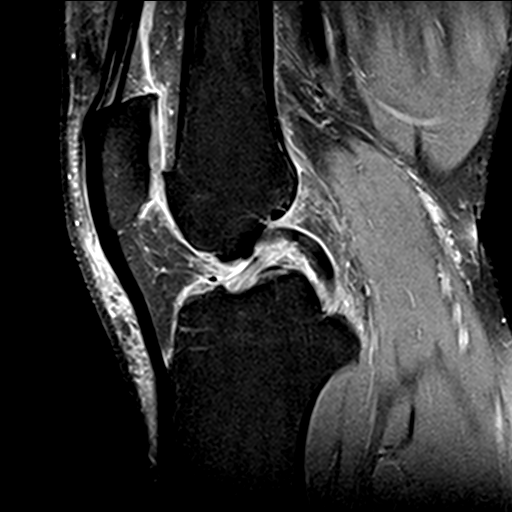
[im 21/25]
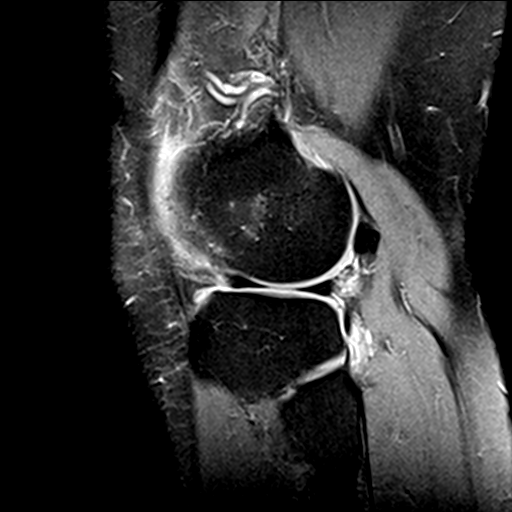

[Series 7: PD fat-sat · coronal · 3.5mm · 0.31mm/px · 3 of 24 slices shown (3 of 4)]
[im 4/24]
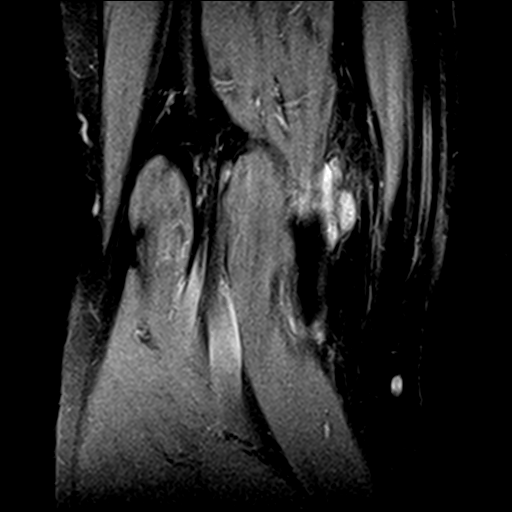
[im 12/24]
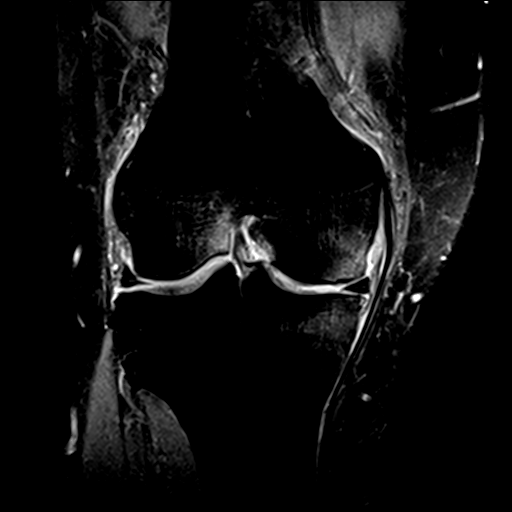
[im 20/24]
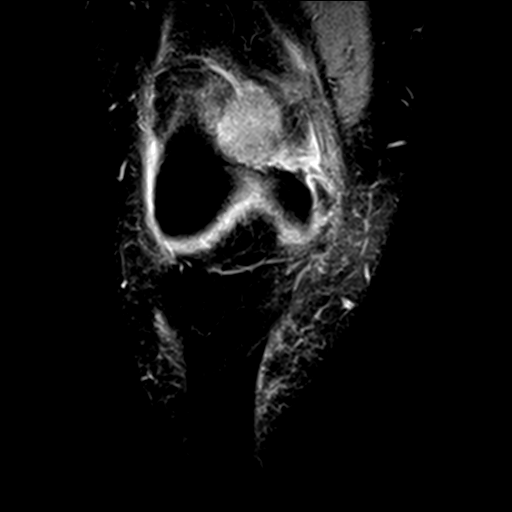

[Series 8: PD fat-sat · oblique · 2.0mm · 0.29mm/px · 3 of 15 slices shown (4 of 4)]
[im 1/15]
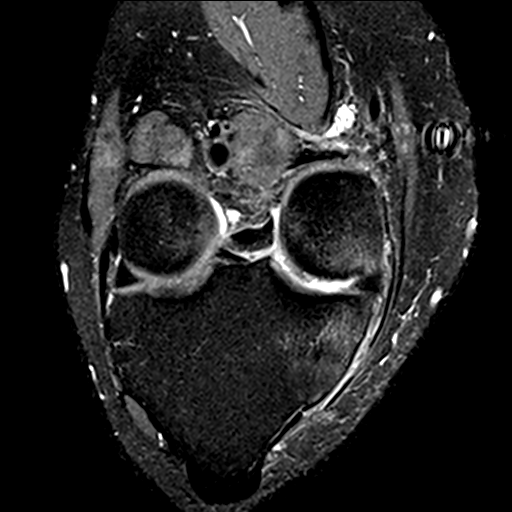
[im 10/15]
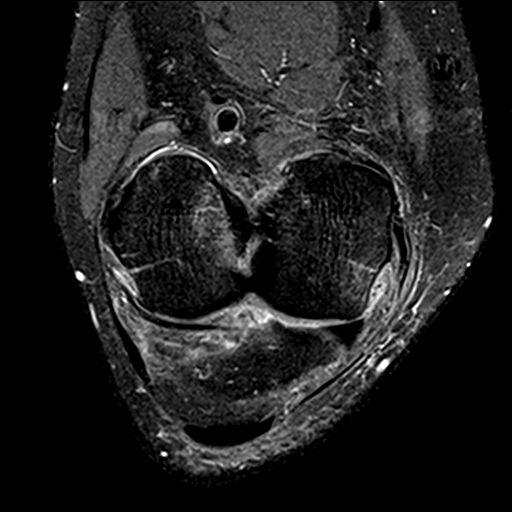
[im 15/15]
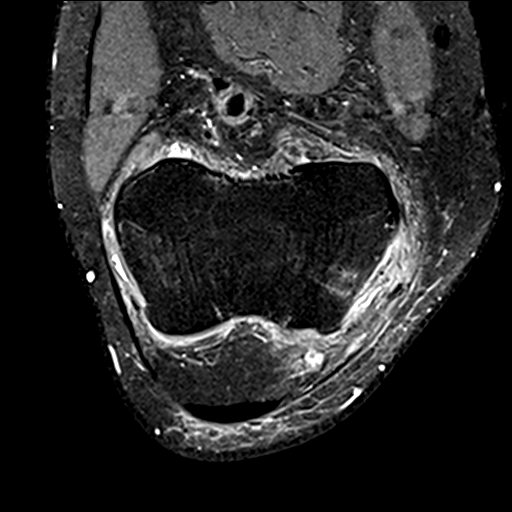

[19 of 40 positions shown; findings below may reference images not displayed]

FINDINGS: MENISCI

Medial meniscus: Radial tear of the posterior horn of the medial
meniscus.

Lateral meniscus:  Intact.

LIGAMENTS

Cruciates:  Intact ACL and PCL.

Collaterals: Medial collateral ligament is intact. Lateral
collateral ligament complex is intact.

CARTILAGE

Patellofemoral: Mild cartilage fissuring of the medial patellar
facet.

Medial: High-grade partial-thickness cartilage loss with large areas
of full-thickness cartilage loss of the medial femoral condyle and
medial tibial plateau with subchondral reactive marrow edema.

Lateral: High-grade partial-thickness cartilage loss of medial
aspect of the lateral femoral condyle with severe subchondral
reactive marrow edema.

Joint: Small joint effusion. Normal Hoffa's fat. No plical
thickening.

Popliteal Fossa:  Small Baker's cyst.  Intact popliteus tendon.

Extensor Mechanism: Intact quadriceps tendon. Intact patellar
tendon. Intact medial patellar retinaculum. Intact lateral patellar
retinaculum. Intact MPFL.

Bones: No aggressive osseous lesion. Subchondral low signal in the
medial aspect of the lateral femoral condyle concerning for an
insufficiency fracture.

Other: No fluid collection or hematoma.
IMPRESSION: 1. Radial tear of the posterior horn of the medial meniscus.
2. Tricompartmental cartilage abnormalities as described above, most
severe in the medial femorotibial compartment.
3. Subchondral low signal in the medial aspect of the lateral
femoral condyle concerning for an insufficiency fracture.

## 2018-09-30 ENCOUNTER — Encounter: Payer: Self-pay | Admitting: Orthopaedic Surgery

## 2018-09-30 ENCOUNTER — Other Ambulatory Visit: Payer: Self-pay

## 2018-09-30 ENCOUNTER — Ambulatory Visit (INDEPENDENT_AMBULATORY_CARE_PROVIDER_SITE_OTHER): Payer: 59 | Admitting: Orthopaedic Surgery

## 2018-09-30 ENCOUNTER — Telehealth: Payer: Self-pay | Admitting: *Deleted

## 2018-09-30 DIAGNOSIS — M1711 Unilateral primary osteoarthritis, right knee: Secondary | ICD-10-CM | POA: Diagnosis not present

## 2018-09-30 NOTE — Telephone Encounter (Signed)
Please apply for visco for right knee for Michael [name]. Thank you.

## 2018-09-30 NOTE — Progress Notes (Signed)
Office Visit Note   Patient: Michael [name]           Date of Birth: [birth year]           MRN: [id number] Visit Date: 09/30/2018              Requested by: Shirline Frees, MD [address],  [place] [address] PCP: Shirline Frees, MD   Assessment & Plan: Visit Diagnoses:  1. Unilateral primary osteoarthritis, right knee     Plan: Recurrent symptoms of osteoarthritis right knee.  Predominately patellofemoral pain with some referred discomfort of the popliteal space.  Will inject the knee with cortisone and monitor response.  Follow-Up Instructions: Return if symptoms worsen or fail to improve.   Orders:  No orders of the defined types were placed in this encounter.  No orders of the defined types were placed in this encounter.     Procedures: No procedures performed   Clinical Data: No additional findings.   Subjective: Chief Complaint  Patient presents with  . Right Leg - Pain  Michael [name] had a right knee arthroscopy in February 2019.  He had a tear of the medial meniscus as well as some degenerative changes at the patellofemoral joint and the medial compartment.  He did well postoperatively but has had some recurrent symptoms of chondromalacia patella.  When he sits in the car or at a desk or with his knee flexed for any length of time he has some discomfort along the anterior aspect of his knee.  On occasion he will have some pain referred to the popliteal space.  Rarely does he have any swelling.  He is not having much medial lateral joint pain.  HPI  Review of Systems   Objective: Vital Signs: BP (!) 141/78 (BP Location: Left Arm, Patient Position: Sitting, Cuff Size: Normal)   Pulse 79   Resp 16   Ht 5\' 10"  (1.778 m)   Wt 218 lb (98.9 kg)   BMI 31.28 kg/m   Physical Exam Constitutional:      Appearance: He is well-developed.  Eyes:     Pupils: Pupils are equal, round, and reactive to light.  Pulmonary:     Effort: Pulmonary effort is  normal.  Skin:    General: Skin is warm and dry.  Neurological:     Mental Status: He is alert and oriented to person, place, and time.  Psychiatric:        Behavior: Behavior normal.     Ortho Exam right knee with positive patellar crepitation and mild patella pain with compression.  No medial or lateral joint pain.  No effusion.  Full extension and flexion over 105 degrees.  No opening with varus or valgus stress.  Straight leg raise negative.  Painless range of motion both hips.  No distal edema.  Neurologically intact Specialty Comments:  No specialty comments available.  Imaging: No results found.   PMFS History: Patient Active Problem List   Diagnosis Date Noted  . Unilateral primary osteoarthritis, right knee 09/30/2018  . Pain in right hip 05/24/2018  . Chronic pain of left knee 05/24/2018  . Osteoarthritis of acromioclavicular joint 04/05/2013  . Impingement syndrome of right shoulder 04/05/2013   Past Medical History:  Diagnosis Date  . Arthritis   . Hyperlipemia   . Medical history non-contributory   . Wears glasses     History reviewed. No pertinent family history.  Past Surgical History:  Procedure Laterality Date  .  COLONOSCOPY    . KNEE ARTHROSCOPY  9/12   left  . KNEE ARTHROSCOPY    . SHOULDER ARTHROSCOPY    . SHOULDER ARTHROSCOPY WITH OPEN ROTATOR CUFF REPAIR AND DISTAL CLAVICLE ACROMINECTOMY Right 04/05/2013   Procedure: SHOULDER ARTHROSCOPIC DEBRIDEMENT, ACROMIOPLASTY, DISTAL CLAVICLE RESECTION, POSSIBLE MINI OPEN ROTATOR CUFF REPAIR.  ;  Surgeon: Garald Balding, MD;  Location: [facility];  Service: Orthopedics;  Laterality: Right;  ARTHROSCOPIC DEBRIDEMENT SHOULDER, ACORMIOPLASTY, DISTAL CLAVICLE RESECTION, POSSIBLE MINI OPEN ROTATOR CUFF REPAIR.     Social History   Occupational History  . Not on file  Tobacco Use  . Smoking status: Never Smoker  . Smokeless tobacco: Never Used  Substance and Sexual Activity  . Alcohol use: Yes     Comment: occ  . Drug use: No  . Sexual activity: Not on file     Garald Balding, MD   Note - This record has been created using [employer].  Chart creation errors have been sought, but may not always  have been located. Such creation errors do not reflect on  the standard of medical care.

## 2018-09-30 NOTE — Progress Notes (Deleted)
Office Visit Note   Patient: Michael [name]           Date of Birth: 08-Feb-1956           MRN: [id number] Visit Date: 09/30/2018              Requested by: Shirline Frees, MD [address] [place],  [place] [address] PCP: Shirline Frees, MD   Assessment & Plan: Visit Diagnoses: No diagnosis found.  Plan: ***  Follow-Up Instructions: No follow-ups on file.   Orders:  No orders of the defined types were placed in this encounter.  No orders of the defined types were placed in this encounter.     Procedures: No procedures performed   Clinical Data: No additional findings.   Subjective: Chief Complaint  Patient presents with  . Right Leg - Pain   Michael [name] is a 63 year old male who presents today with right upper leg and right hip pain x 3 months, worsening. He is having cramps. He is having difficulty walking and  difficulty sleeping at night. He has not had an injury. He is not diabetic. He has had right knee arthroscopic surgery by Dr. Durward Fortes. He takes IBU for pain which helps at times. He is experiencing difficulty walking after sitting for long periods of time. He has difficulty straightening his leg. He feels like the muscles in the back of his leg and knee are tightening. He has sharp, shooting pain from his right hip to his right knee at times. He is experiencing some weakness.  HPI  Review of Systems  Constitutional: Positive for fatigue.  HENT: Negative for trouble swallowing.   Eyes: Negative for pain.  Respiratory: Negative for shortness of breath.   Cardiovascular: Positive for leg swelling.  Gastrointestinal: Negative for constipation.  Endocrine: Negative for cold intolerance.  Genitourinary: Negative for difficulty urinating.  Musculoskeletal: Positive for joint swelling.  Skin: Negative for rash.  Allergic/Immunologic: Negative for food allergies.  Neurological: Positive for weakness.  Hematological: Does not bruise/bleed easily.   Psychiatric/Behavioral: Positive for sleep disturbance.     Objective: Vital Signs: BP (!) 141/78 (BP Location: Left Arm, Patient Position: Sitting, Cuff Size: Normal)   Pulse 79   Resp 16   Ht 5\' 10"  (1.778 m)   Wt 218 lb (98.9 kg)   BMI 31.28 kg/m   Physical Exam  Ortho Exam  Specialty Comments:  No specialty comments available.  Imaging: No results found.   PMFS History: Patient Active Problem List   Diagnosis Date Noted  . Pain in right hip 05/24/2018  . Chronic pain of left knee 05/24/2018  . Osteoarthritis of acromioclavicular joint 04/05/2013  . Impingement syndrome of right shoulder 04/05/2013   Past Medical History:  Diagnosis Date  . Arthritis   . Hyperlipemia   . Medical history non-contributory   . Wears glasses     History reviewed. No pertinent family history.  Past Surgical History:  Procedure Laterality Date  . COLONOSCOPY    . KNEE ARTHROSCOPY  9/12   left  . KNEE ARTHROSCOPY    . SHOULDER ARTHROSCOPY    . SHOULDER ARTHROSCOPY WITH OPEN ROTATOR CUFF REPAIR AND DISTAL CLAVICLE ACROMINECTOMY Right 04/05/2013   Procedure: SHOULDER ARTHROSCOPIC DEBRIDEMENT, ACROMIOPLASTY, DISTAL CLAVICLE RESECTION, POSSIBLE MINI OPEN ROTATOR CUFF REPAIR.  ;  Surgeon: Garald Balding, MD;  Location: [facility];  Service: Orthopedics;  Laterality: Right;  ARTHROSCOPIC DEBRIDEMENT SHOULDER, ACORMIOPLASTY, DISTAL CLAVICLE RESECTION, POSSIBLE MINI  OPEN ROTATOR CUFF REPAIR.     Social History   Occupational History  . Not on file  Tobacco Use  . Smoking status: Never Smoker  . Smokeless tobacco: Never Used  Substance and Sexual Activity  . Alcohol use: Yes    Comment: occ  . Drug use: No  . Sexual activity: Not on file

## 2018-10-06 NOTE — Telephone Encounter (Signed)
Noted  

## 2018-10-07 ENCOUNTER — Telehealth: Payer: Self-pay

## 2018-10-07 NOTE — Telephone Encounter (Signed)
Submitted VOB for Euflexxa, right knee.

## 2018-10-11 ENCOUNTER — Telehealth: Payer: Self-pay

## 2018-10-11 NOTE — Telephone Encounter (Signed)
Please schedule patient an appointment with Dr. Durward Fortes or Aaron Edelman for gel injection.  Thank You.  Patient is approved for Euflexxa, right knee. [place] Must meet deductible first. Patient will be responsible for 20% OOP. Co-pay of $50.00 No PA required

## 2018-10-12 NOTE — Telephone Encounter (Signed)
Called patient to schedule Euflexxa injections for his right knee.  Patient states he will need to schedule in mid-September due to work schedule.  We will call patient back when Dr. Durward Fortes opens September schedule.

## 2018-12-27 ENCOUNTER — Telehealth: Payer: Self-pay | Admitting: Orthopaedic Surgery

## 2018-12-27 NOTE — Telephone Encounter (Signed)
FYI:  Patient called and scheduled an appointment with Dr. Durward Fortes on Thursday, 12/30/18 for his right knee.  Patient was approved for Euflexxa injections, but stated he is not ready to schedule those appointments yet.

## 2018-12-30 ENCOUNTER — Ambulatory Visit (INDEPENDENT_AMBULATORY_CARE_PROVIDER_SITE_OTHER): Payer: 59 | Admitting: Orthopaedic Surgery

## 2018-12-30 ENCOUNTER — Encounter: Payer: Self-pay | Admitting: Orthopaedic Surgery

## 2018-12-30 ENCOUNTER — Other Ambulatory Visit: Payer: Self-pay

## 2018-12-30 ENCOUNTER — Telehealth: Payer: Self-pay | Admitting: Orthopaedic Surgery

## 2018-12-30 VITALS — BP 141/80 | HR 73 | Ht 70.0 in | Wt 218.0 lb

## 2018-12-30 DIAGNOSIS — M1711 Unilateral primary osteoarthritis, right knee: Secondary | ICD-10-CM

## 2018-12-30 MED ORDER — SODIUM HYALURONATE (VISCOSUP) 20 MG/2ML IX SOSY
20.0000 mg | PREFILLED_SYRINGE | INTRA_ARTICULAR | Status: AC | PRN
  Administered 2018-12-30: 14:00:00 20 mg via INTRA_ARTICULAR

## 2018-12-30 NOTE — Progress Notes (Signed)
Office Visit Note   Patient: Michael [name]           Date of Birth: 09/17/1955           MRN: [id number] Visit Date: 12/30/2018              Requested by: Shirline Frees, MD [address] [place],  [place] [address] PCP: Shirline Frees, MD   Assessment & Plan: Visit Diagnoses:  1. Unilateral primary osteoarthritis, right knee     Plan: Mr. Wuellner has been approved for Euflexxa.  The first injection was performed today the right knee.  Follow-up weekly for the next 2 weeks to complete the series  Follow-Up Instructions: No follow-ups on file.   Orders:  Orders Placed This Encounter  Procedures  . Large Joint Inj: R knee   No orders of the defined types were placed in this encounter.     Procedures: Large Joint Inj: R knee on 12/30/2018 2:00 PM Indications: pain and joint swelling Details: 25 G 1.5 in needle  Arthrogram: No  Medications: 20 mg Sodium Hyaluronate 20 MG/2ML Outcome: tolerated well, no immediate complications Procedure, treatment alternatives, risks and benefits explained, specific risks discussed. Consent was given by the patient. Immediately prior to procedure a time out was called to verify the correct patient, procedure, equipment, support staff and site/side marked as required. Patient was prepped and draped in the usual sterile fashion.       Clinical Data: No additional findings.   Subjective: Chief Complaint  Patient presents with  . Right Knee - Follow-up  Patient presents today for recurrent right knee pain. He received a cortisone injection on 09/30/2018. He said that the injection helped, but has been hurting again for several weeks. He is wanting to talk about the visco injections.  Has been approved for Euflexxa in the right knee  HPI  Review of Systems   Objective: Vital Signs: BP (!) 141/80   Pulse 73   Ht 5\' 10"  (1.778 m)   Wt 218 lb (98.9 kg)   BMI 31.28 kg/m   Physical Exam  Ortho Exam right knee was not hot  warm red or swollen.  Does have diffuse mild medial joint pain.  No popping or clicking.  Lacks just a few degrees to full extension and flexed about 95 degrees at which point he was "tight".  No distal edema.  No calf pain.  No pain with range of motion of either hip  Specialty Comments:  No specialty comments available.  Imaging: No results found.   PMFS History: Patient Active Problem List   Diagnosis Date Noted  . Unilateral primary osteoarthritis, right knee 09/30/2018  . Pain in right hip 05/24/2018  . Chronic pain of left knee 05/24/2018  . Osteoarthritis of acromioclavicular joint 04/05/2013  . Impingement syndrome of right shoulder 04/05/2013   Past Medical History:  Diagnosis Date  . Arthritis   . Hyperlipemia   . Medical history non-contributory   . Wears glasses     History reviewed. No pertinent family history.  Past Surgical History:  Procedure Laterality Date  . COLONOSCOPY    . KNEE ARTHROSCOPY  9/12   left  . KNEE ARTHROSCOPY    . SHOULDER ARTHROSCOPY    . SHOULDER ARTHROSCOPY WITH OPEN ROTATOR CUFF REPAIR AND DISTAL CLAVICLE ACROMINECTOMY Right 04/05/2013   Procedure: SHOULDER ARTHROSCOPIC DEBRIDEMENT, ACROMIOPLASTY, DISTAL CLAVICLE RESECTION, POSSIBLE MINI OPEN ROTATOR CUFF REPAIR.  ;  Surgeon: Garald Balding, MD;  Location: [facility];  Service: Orthopedics;  Laterality: Right;  ARTHROSCOPIC DEBRIDEMENT SHOULDER, ACORMIOPLASTY, DISTAL CLAVICLE RESECTION, POSSIBLE MINI OPEN ROTATOR CUFF REPAIR.     Social History   Occupational History  . Not on file  Tobacco Use  . Smoking status: Never Smoker  . Smokeless tobacco: Never Used  Substance and Sexual Activity  . Alcohol use: Yes    Comment: occ  . Drug use: No  . Sexual activity: Not on file

## 2018-12-30 NOTE — Telephone Encounter (Signed)
Please precert visco for patient's left knee. This is Dr.Whitfield's patient.

## 2019-01-03 NOTE — Telephone Encounter (Signed)
Noted  

## 2019-01-05 ENCOUNTER — Telehealth: Payer: Self-pay

## 2019-01-05 NOTE — Telephone Encounter (Signed)
Submitted VOB for Euflexxa series, left knee.

## 2019-01-06 ENCOUNTER — Ambulatory Visit (INDEPENDENT_AMBULATORY_CARE_PROVIDER_SITE_OTHER): Payer: 59 | Admitting: Orthopaedic Surgery

## 2019-01-06 ENCOUNTER — Other Ambulatory Visit: Payer: Self-pay

## 2019-01-06 ENCOUNTER — Encounter: Payer: Self-pay | Admitting: Orthopaedic Surgery

## 2019-01-06 VITALS — BP 130/78 | HR 89 | Ht 70.0 in | Wt 218.0 lb

## 2019-01-06 DIAGNOSIS — M1711 Unilateral primary osteoarthritis, right knee: Secondary | ICD-10-CM | POA: Diagnosis not present

## 2019-01-06 MED ORDER — SODIUM HYALURONATE (VISCOSUP) 20 MG/2ML IX SOSY
20.0000 mg | PREFILLED_SYRINGE | INTRA_ARTICULAR | Status: AC | PRN
  Administered 2019-01-06: 20 mg via INTRA_ARTICULAR

## 2019-01-06 NOTE — Progress Notes (Signed)
Office Visit Note   Patient: Michael [name]           Date of Birth: [birth year]           MRN: [id number] Visit Date: 01/06/2019              Requested by: Shirline Frees, MD [address],  [place] [address] PCP: Shirline Frees, MD   Assessment & Plan: Visit Diagnoses:  1. Unilateral primary osteoarthritis, right knee     Plan: Second Euflexxa injection right knee.  Return in 1 week to complete the series of 3  Follow-Up Instructions: Return in about 1 week (around 01/13/2019).   Orders:  Orders Placed This Encounter  Procedures  . Large Joint Inj: R knee   No orders of the defined types were placed in this encounter.     Procedures: Large Joint Inj: R knee on 01/06/2019 3:57 PM Indications: pain and joint swelling Details: 25 G 1.5 in needle  Arthrogram: No  Medications: 20 mg Sodium Hyaluronate 20 MG/2ML Outcome: tolerated well, no immediate complications Procedure, treatment alternatives, risks and benefits explained, specific risks discussed. Consent was given by the patient. Immediately prior to procedure a time out was called to verify the correct patient, procedure, equipment, support staff and site/side marked as required. Patient was prepped and draped in the usual sterile fashion.       Clinical Data: No additional findings.   Subjective: Chief Complaint  Patient presents with  . Right Knee - Follow-up    Euflexxa started 12/30/2018  Patient presents today for the second Euflexxa injection in the right knee. He started the injections on 12/30/2018.  He can already tell a little bit of a difference  HPI  Review of Systems   Objective: Vital Signs: BP 130/78   Pulse 89   Ht 5\' 10"  (1.778 m)   Wt 218 lb (98.9 kg)   BMI 31.28 kg/m   Physical Exam  Ortho Exam right knee was not had red warm or swollen possibly minimal effusion.  Minimal medial joint pain.  Walks without a limp  Specialty Comments:  No specialty  comments available.  Imaging: No results found.   PMFS History: Patient Active Problem List   Diagnosis Date Noted  . Unilateral primary osteoarthritis, right knee 09/30/2018  . Pain in right hip 05/24/2018  . Chronic pain of left knee 05/24/2018  . Osteoarthritis of acromioclavicular joint 04/05/2013  . Impingement syndrome of right shoulder 04/05/2013   Past Medical History:  Diagnosis Date  . Arthritis   . Hyperlipemia   . Medical history non-contributory   . Wears glasses     History reviewed. No pertinent family history.  Past Surgical History:  Procedure Laterality Date  . COLONOSCOPY    . KNEE ARTHROSCOPY  9/12   left  . KNEE ARTHROSCOPY    . SHOULDER ARTHROSCOPY    . SHOULDER ARTHROSCOPY WITH OPEN ROTATOR CUFF REPAIR AND DISTAL CLAVICLE ACROMINECTOMY Right 04/05/2013   Procedure: SHOULDER ARTHROSCOPIC DEBRIDEMENT, ACROMIOPLASTY, DISTAL CLAVICLE RESECTION, POSSIBLE MINI OPEN ROTATOR CUFF REPAIR.  ;  Surgeon: Garald Balding, MD;  Location: [facility];  Service: Orthopedics;  Laterality: Right;  ARTHROSCOPIC DEBRIDEMENT SHOULDER, ACORMIOPLASTY, DISTAL CLAVICLE RESECTION, POSSIBLE MINI OPEN ROTATOR CUFF REPAIR.     Social History   Occupational History  . Not on file  Tobacco Use  . Smoking status: Never Smoker  . Smokeless tobacco: Never Used  Substance and Sexual Activity  .  Alcohol use: Yes    Comment: occ  . Drug use: No  . Sexual activity: Not on file

## 2019-01-13 ENCOUNTER — Encounter: Payer: Self-pay | Admitting: Orthopaedic Surgery

## 2019-01-13 ENCOUNTER — Ambulatory Visit (INDEPENDENT_AMBULATORY_CARE_PROVIDER_SITE_OTHER): Payer: 59 | Admitting: Orthopaedic Surgery

## 2019-01-13 ENCOUNTER — Other Ambulatory Visit: Payer: Self-pay

## 2019-01-13 VITALS — Ht 70.0 in | Wt 218.0 lb

## 2019-01-13 DIAGNOSIS — M1711 Unilateral primary osteoarthritis, right knee: Secondary | ICD-10-CM

## 2019-01-13 MED ORDER — LIDOCAINE HCL 1 % IJ SOLN
2.0000 mL | INTRAMUSCULAR | Status: AC | PRN
  Administered 2019-01-13: 2 mL

## 2019-01-13 MED ORDER — SODIUM HYALURONATE (VISCOSUP) 20 MG/2ML IX SOSY
20.0000 mg | PREFILLED_SYRINGE | INTRA_ARTICULAR | Status: AC | PRN
  Administered 2019-01-13: 20 mg via INTRA_ARTICULAR

## 2019-01-13 NOTE — Progress Notes (Signed)
Office Visit Note   Patient: Michael [name]           Date of Birth: [birth year]           MRN: [id number] Visit Date: 01/13/2019              Requested by: Shirline Frees, MD [address],  [place] [address] PCP: Shirline Frees, MD   Assessment & Plan: Visit Diagnoses:  1. Unilateral primary osteoarthritis, right knee     Plan: Third Euflexxa injection right knee.  Awaiting approval for the same on the left knee  Follow-Up Instructions: Return if symptoms worsen or fail to improve.   Orders:  Orders Placed This Encounter  Procedures  . Large Joint Inj: R knee   No orders of the defined types were placed in this encounter.     Procedures: Large Joint Inj: R knee on 01/13/2019 4:21 PM Indications: pain and joint swelling Details: 25 G 1.5 in needle  Arthrogram: No  Medications: 20 mg Sodium Hyaluronate 20 MG/2ML; 2 mL lidocaine 1 % Outcome: tolerated well, no immediate complications Procedure, treatment alternatives, risks and benefits explained, specific risks discussed. Consent was given by the patient. Immediately prior to procedure a time out was called to verify the correct patient, procedure, equipment, support staff and site/side marked as required. Patient was prepped and draped in the usual sterile fashion.       Clinical Data: No additional findings.   Subjective: Chief Complaint  Patient presents with  . Right Knee - Follow-up    Euflexxa started on 12/30/2018  Patient presents today for the third Euflexxa injection in his right knee. He started the injections on 12/30/2018. Patient states that he is doing well.   HPI  Review of Systems   Objective: Vital Signs: Ht 5\' 10"  (1.778 m)   Wt 218 lb (98.9 kg)   BMI 31.28 kg/m   Physical Exam  Ortho Exam right knee was not hot red warm or swollen full range of motion.  No limp  Specialty Comments:  No specialty comments available.  Imaging: No results found.   PMFS  History: Patient Active Problem List   Diagnosis Date Noted  . Unilateral primary osteoarthritis, right knee 09/30/2018  . Pain in right hip 05/24/2018  . Chronic pain of left knee 05/24/2018  . Osteoarthritis of acromioclavicular joint 04/05/2013  . Impingement syndrome of right shoulder 04/05/2013   Past Medical History:  Diagnosis Date  . Arthritis   . Hyperlipemia   . Medical history non-contributory   . Wears glasses     History reviewed. No pertinent family history.  Past Surgical History:  Procedure Laterality Date  . COLONOSCOPY    . KNEE ARTHROSCOPY  9/12   left  . KNEE ARTHROSCOPY    . SHOULDER ARTHROSCOPY    . SHOULDER ARTHROSCOPY WITH OPEN ROTATOR CUFF REPAIR AND DISTAL CLAVICLE ACROMINECTOMY Right 04/05/2013   Procedure: SHOULDER ARTHROSCOPIC DEBRIDEMENT, ACROMIOPLASTY, DISTAL CLAVICLE RESECTION, POSSIBLE MINI OPEN ROTATOR CUFF REPAIR.  ;  Surgeon: Garald Balding, MD;  Location: [facility];  Service: Orthopedics;  Laterality: Right;  ARTHROSCOPIC DEBRIDEMENT SHOULDER, ACORMIOPLASTY, DISTAL CLAVICLE RESECTION, POSSIBLE MINI OPEN ROTATOR CUFF REPAIR.     Social History   Occupational History  . Not on file  Tobacco Use  . Smoking status: Never Smoker  . Smokeless tobacco: Never Used  Substance and Sexual Activity  . Alcohol use: Yes    Comment: occ  .  Drug use: No  . Sexual activity: Not on file       

## 2019-01-14 ENCOUNTER — Telehealth: Payer: Self-pay

## 2019-01-14 NOTE — Telephone Encounter (Signed)
Please schedule patient an appointment with Dr. Durward Fortes for gel injection.  Thank you.  Approved for Euflexxa series, left knee. Buy & Bill Covered at 100% of allowable amount Only one co-pay will apply per date of service Co-pay of $50.00 No PA required

## 2019-03-08 ENCOUNTER — Other Ambulatory Visit: Payer: Self-pay

## 2019-03-08 DIAGNOSIS — Z20822 Contact with and (suspected) exposure to covid-19: Secondary | ICD-10-CM

## 2019-03-10 LAB — NOVEL CORONAVIRUS, NAA: SARS-CoV-2, NAA: NOT DETECTED

## 2020-09-04 ENCOUNTER — Other Ambulatory Visit: Payer: Self-pay

## 2020-09-04 ENCOUNTER — Ambulatory Visit: Payer: 59 | Admitting: Podiatry

## 2020-09-04 ENCOUNTER — Encounter: Payer: Self-pay | Admitting: Podiatry

## 2020-09-04 DIAGNOSIS — N401 Enlarged prostate with lower urinary tract symptoms: Secondary | ICD-10-CM | POA: Insufficient documentation

## 2020-09-04 DIAGNOSIS — E7800 Pure hypercholesterolemia, unspecified: Secondary | ICD-10-CM | POA: Insufficient documentation

## 2020-09-04 DIAGNOSIS — L03031 Cellulitis of right toe: Secondary | ICD-10-CM

## 2020-09-04 NOTE — Patient Instructions (Signed)

## 2020-09-05 NOTE — Progress Notes (Signed)
Subjective:  Patient ID: Michael [name], male    DOB: [birth year],  MRN: [id number] HPI Chief Complaint  Patient presents with  . Toe Injury    Hallux right - stumped toe 1 week ago, nail was already thick and discolored, tried trimming, red and swollen, but better than it was, using neosporin and bandaid since last Thursday  . New Patient (Initial Visit)    Est pt 2018    65 y.o. male presents with the above complaint.   ROS: Denies fever chills nausea vomiting muscle aches pains calf pain back pain chest pain shortness of breath.  Past Medical History:  Diagnosis Date  . Arthritis   . Hyperlipemia   . Medical history non-contributory   . Wears glasses    Past Surgical History:  Procedure Laterality Date  . COLONOSCOPY    . KNEE ARTHROSCOPY  9/12   left  . KNEE ARTHROSCOPY    . SHOULDER ARTHROSCOPY    . SHOULDER ARTHROSCOPY WITH OPEN ROTATOR CUFF REPAIR AND DISTAL CLAVICLE ACROMINECTOMY Right 04/05/2013   Procedure: SHOULDER ARTHROSCOPIC DEBRIDEMENT, ACROMIOPLASTY, DISTAL CLAVICLE RESECTION, POSSIBLE MINI OPEN ROTATOR CUFF REPAIR.  ;  Surgeon: Garald Balding, MD;  Location: [facility];  Service: Orthopedics;  Laterality: Right;  ARTHROSCOPIC DEBRIDEMENT SHOULDER, ACORMIOPLASTY, DISTAL CLAVICLE RESECTION, POSSIBLE MINI OPEN ROTATOR CUFF REPAIR.      Current Outpatient Medications:  Marland Kitchen  Multiple Vitamin (MULTIVITAMIN ADULT PO), Take by mouth., Disp: , Rfl:  .  Ascorbic Acid (VITAMIN C) 100 MG tablet, 1 tablet, Disp: , Rfl:   No Known Allergies Review of Systems Objective:  There were no vitals filed for this visit.  General: Well developed, nourished, in no acute distress, alert and oriented x3   Dermatological: Skin is warm, dry and supple bilateral. Nails x 10 are well maintained; remaining integument appears unremarkable at this time. There are no open sores, no preulcerative lesions, no rash or signs of infection present.  Hallux right demonstrates trauma  to the nail plate with his ongoing bowel seroma or hematoma that is because the nail to separate from the nailbed.  Currently does not demonstrate any purulence or malodor.  There is some postinflammatory hyperpigmentation most likely secondary to previous inflammatory process either due to trauma or infection.  Vascular: Dorsalis Pedis artery and Posterior Tibial artery pedal pulses are 2/4 bilateral with immedate capillary fill time. Pedal hair growth present. No varicosities and no lower extremity edema present bilateral.   Neruologic: Grossly intact via light touch bilateral. Vibratory intact via tuning fork bilateral. Protective threshold with Semmes Wienstein monofilament intact to all pedal sites bilateral. Patellar and Achilles deep tendon reflexes 2+ bilateral. No Babinski or clonus noted bilateral.   Musculoskeletal: No gross boney pedal deformities bilateral. No pain, crepitus, or limitation noted with foot and ankle range of motion bilateral. Muscular strength 5/5 in all groups tested bilateral.  Gait: Unassisted, Nonantalgic.    Radiographs:  None taken  Assessment & Plan:   Assessment: Subungual seroma with resolving bacterial infection hallux right.  Plan: Total nail avulsion performed today after local anesthetic was administered tolerated procedure well.  Nailbed was intact no signs of fracture or laceration of the bed.  It was nice and clean no signs of abscess purulence malodor.  He was given both oral and written home-going structure of the care and soaking of the toe as well as a prescription for Cortisporin Otic to be applied twice daily.  Follow-up with him in 2 weeks.  Garrel Ridgel, DPM

## 2020-09-18 ENCOUNTER — Ambulatory Visit: Payer: 59 | Admitting: Podiatry

## 2020-09-27 ENCOUNTER — Encounter: Payer: Self-pay | Admitting: Podiatry

## 2020-09-27 ENCOUNTER — Ambulatory Visit: Payer: 59 | Admitting: Podiatry

## 2020-09-27 ENCOUNTER — Other Ambulatory Visit: Payer: Self-pay

## 2020-09-27 DIAGNOSIS — Z9889 Other specified postprocedural states: Secondary | ICD-10-CM

## 2020-09-27 DIAGNOSIS — L03031 Cellulitis of right toe: Secondary | ICD-10-CM

## 2020-09-27 NOTE — Progress Notes (Signed)
He presents today for follow-up of his nail avulsion hallux right.  He states that is doing just great no problems whatsoever.  Objective: Vital signs are stable is alert oriented x3 hallux right demonstrates no nail plate.  There is no erythema edema cellulitis drainage or odor no signs of infection.  Assessment: Well-healing surgical toe.  Plan: Follow-up with him on an as-needed basis.

## 2020-12-20 ENCOUNTER — Encounter: Payer: Self-pay | Admitting: Orthopaedic Surgery

## 2020-12-20 ENCOUNTER — Ambulatory Visit: Payer: Self-pay

## 2020-12-20 ENCOUNTER — Ambulatory Visit (INDEPENDENT_AMBULATORY_CARE_PROVIDER_SITE_OTHER): Payer: 59 | Admitting: Orthopaedic Surgery

## 2020-12-20 ENCOUNTER — Other Ambulatory Visit: Payer: Self-pay

## 2020-12-20 DIAGNOSIS — M79644 Pain in right finger(s): Secondary | ICD-10-CM

## 2020-12-20 DIAGNOSIS — M79645 Pain in left finger(s): Secondary | ICD-10-CM | POA: Diagnosis not present

## 2020-12-20 DIAGNOSIS — M180 Bilateral primary osteoarthritis of first carpometacarpal joints: Secondary | ICD-10-CM

## 2020-12-20 NOTE — Progress Notes (Signed)
Office Visit Note   Patient: Michael [name]           Date of Birth: [birth year]           MRN: [id number] Visit Date: 12/20/2020              Requested by: Shirline Frees, MD [address] [place],  [place] [address] PCP: Shirline Frees, MD   Assessment & Plan: Visit Diagnoses:  1. Pain of right thumb   2. Pain of left thumb   3. Primary osteoarthritis of both first carpometacarpal joints     Plan: Bilateral osteoarthritis base of both thumbs.  X-ray changes are relatively mild to moderate.  Long discussion regarding treatment options including NSAIDs and Voltaren gel.  I discussed cortisone injection even potential surgery over time.  There are braces available but he feels like that might at this point interfere with his repetitive use of the hands we will plan to see him back in sometime in the future.  All questions were answered  Follow-Up Instructions: Return if symptoms worsen or fail to improve.   Orders:  Orders Placed This Encounter  Procedures   XR Finger Thumb Left   XR Finger Thumb Right   No orders of the defined types were placed in this encounter.     Procedures: No procedures performed   Clinical Data: No additional findings.   Subjective: Chief Complaint  Patient presents with   Right Hand - Pain   Left Hand - Pain  Patient presents today for bilateral thumb pain. He states that his pain is located at the base of his thumbs. The right side is worse than the left. He gets shooting pains if he tries to grip or pick anything up. This has been going on for two months.  No numbness or tingling or pain along the wrist  HPI  Review of Systems   Objective: Vital Signs: There were no vitals taken for this visit.  Physical Exam Constitutional:      Appearance: He is well-developed.  Pulmonary:     Effort: Pulmonary effort is normal.  Skin:    General: Skin is warm and dry.  Neurological:     Mental Status: He is alert and oriented  to person, place, and time.  Psychiatric:        Behavior: Behavior normal.    Ortho Exam both hands were examined with positive grind test at the base of the thumb and some hypertrophic changes consistent with osteoarthritis noted by plain films.  No obvious deformity.  Finklestein's test was negative.  No pain at the radiocarpal joint.  No erythema or ecchymosis.  Neurologically intact.  No triggering  Specialty Comments:  No specialty comments available.  Imaging: XR Finger Thumb Left  Result Date: 12/20/2020 Films of the left thumb were obtained in 3 projections and compared to the right thumb.  Films are similar to the right with degenerative changes at the thumb metacarpal carpal joint.  Mild subluxation and sclerosis.  No significant loss of the joint space.  No acute change or ectopic calcification  XR Finger Thumb Right  Result Date: 12/20/2020 Films of the right thumb were obtained in several projections.  There are degenerative changes at the metacarpal carpal joint with mild lateral subluxation.  There are some sclerotic changes of both sides of the joint.  Joint space is still maintained.  No acute changes or ectopic calcification.  Patient localizes the pain to the base  of the thumb    PMFS History: Patient Active Problem List   Diagnosis Date Noted   Osteoarthritis of carpometacarpal joints of thumbs, bilateral 12/20/2020   Benign prostatic hyperplasia with lower urinary tract symptoms 09/04/2020   Pure hypercholesterolemia 09/04/2020   Unilateral primary osteoarthritis, right knee 09/30/2018   Pain in right hip 05/24/2018   Chronic pain of left knee 05/24/2018   Osteoarthritis of acromioclavicular joint 04/05/2013   Impingement syndrome of right shoulder 04/05/2013   Past Medical History:  Diagnosis Date   Arthritis    Hyperlipemia    Medical history non-contributory    Wears glasses     History reviewed. No pertinent family history.  Past Surgical History:   Procedure Laterality Date   COLONOSCOPY     KNEE ARTHROSCOPY  9/12   left   KNEE ARTHROSCOPY     SHOULDER ARTHROSCOPY     SHOULDER ARTHROSCOPY WITH OPEN ROTATOR CUFF REPAIR AND DISTAL CLAVICLE ACROMINECTOMY Right 04/05/2013   Procedure: SHOULDER ARTHROSCOPIC DEBRIDEMENT, ACROMIOPLASTY, DISTAL CLAVICLE RESECTION, POSSIBLE MINI OPEN ROTATOR CUFF REPAIR.  ;  Surgeon: Garald Balding, MD;  Location: [facility];  Service: Orthopedics;  Laterality: Right;  ARTHROSCOPIC DEBRIDEMENT SHOULDER, ACORMIOPLASTY, DISTAL CLAVICLE RESECTION, POSSIBLE MINI OPEN ROTATOR CUFF REPAIR.     Social History   Occupational History   Not on file  Tobacco Use   Smoking status: Never   Smokeless tobacco: Never  Vaping Use   Vaping Use: Never used  Substance and Sexual Activity   Alcohol use: Yes    Comment: occ   Drug use: No   Sexual activity: Not on file

## 2021-06-26 ENCOUNTER — Encounter: Payer: Self-pay | Admitting: Orthopaedic Surgery

## 2021-06-26 ENCOUNTER — Ambulatory Visit: Payer: 59 | Admitting: Orthopaedic Surgery

## 2021-06-26 DIAGNOSIS — M654 Radial styloid tenosynovitis [de Quervain]: Secondary | ICD-10-CM | POA: Diagnosis not present

## 2021-06-26 DIAGNOSIS — M180 Bilateral primary osteoarthritis of first carpometacarpal joints: Secondary | ICD-10-CM

## 2021-06-26 MED ORDER — LIDOCAINE HCL 1 % IJ SOLN
1.0000 mL | INTRAMUSCULAR | Status: AC | PRN
  Administered 2021-06-26: 1 mL

## 2021-06-26 MED ORDER — METHYLPREDNISOLONE ACETATE 40 MG/ML IJ SUSP
20.0000 mg | INTRAMUSCULAR | Status: AC | PRN
  Administered 2021-06-26: 20 mg

## 2021-06-26 NOTE — Progress Notes (Signed)
? ?Office Visit Note ?  ?Patient: KARSTON HYLAND           ?Date of Birth: 06-Jan-1956           ?MRN: [id number] ?Visit Date: 06/26/2021 ?             ?Requested by: Shirline Frees, MD ?[address] ?[address] ?[place],  [place] [address] ?PCP: Shirline Frees, MD ? ? ?Assessment & Plan: ?Visit Diagnoses:  ?1. Primary osteoarthritis of both first carpometacarpal joints   ?2. De Quervain's disease (radial styloid tenosynovitis)   ? ? ?Plan: Mr. Mayeda has a combination of de Quervain's and arthritis at the base of the left thumb.  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  I have injected both areas and asked him to take it easy for at least 48 hours and asked him to also return if he continues to have a problem over the next several weeks.  He has had x-rays of both thumbs in the past demonstrating degenerative changes.  He did have a positive Finkelstein's test some pain over the first dorsal extensor compartment by exam ? ?Follow-Up Instructions: Return if symptoms worsen or fail to improve.  ? ?Orders:  ?Orders Placed This Encounter  ?Procedures  ? Hand/UE Inj: L thumb CMC  ? Hand/UE Inj  ? ?No orders of the defined types were placed in this encounter. ? ? ? ? Procedures: ?Hand/UE Inj: L thumb CMC for osteoarthritis on 06/26/2021 4:28 PM ?Indications: pain ?Details: 27 G needle, dorsal approach ?Medications: 1 mL lidocaine 1 %; 20 mg methylPREDNISolone acetate 40 MG/ML ? ? ?Hand/UE Inj for de Quervain's tenosynovitis on 06/26/2021 4:30 PM ?Indications: pain and tendon swelling ?Details: 27 G needle, dorsal approach ?Medications: 1 mL lidocaine 1 %; 20 mg methylPREDNISolone acetate 40 MG/ML ? ? ? ? ?Clinical Data: ?No additional findings. ? ? ?Subjective: ?Chief Complaint  ?Patient presents with  ? Right Wrist - Pain  ? Left Wrist - Pain  ?Patient presents today for bilateral wrist pain. He is describing his pain as the base of the his thumbs and radiates up his arms. His left  side is worse than the right. He said that he has seen Dr.Verlinda Slotnick for this before and it is getting worse. He is right hand dominant. He takes Ibuprofen as needed.  ? ?HPI ? ?Review of Systems ? ? ?Objective: ?Vital Signs: There were no vitals taken for this visit. ? ?Physical Exam ?Constitutional:   ?   Appearance: He is well-developed.  ?Pulmonary:  ?   Effort: Pulmonary effort is normal.  ?Skin: ?   General: Skin is warm and dry.  ?Neurological:  ?   Mental Status: He is alert and oriented to person, place, and time.  ?Psychiatric:     ?   Behavior: Behavior normal.  ? ? ?Ortho Exam left hand with some mild swelling at the base of the thumb i.e. carpometacarpal joint with a positive grind test and some mild discomfort.  He is lost a little bit of the grip and has slight subluxation at that joint causing his loss of grip.  Neurologically intact.  Also has a positive Finkelstein's test and discomfort over the first dorsal extensor compartment with very mild swelling.  No Tinel's over the radial nerve and no pain at the radiocarpal joint ? ?Specialty Comments:  ?No specialty comments available. ? ?Imaging: ?No results found. ? ? ?PMFS History: ?Patient Active Problem List  ?  Diagnosis Date Noted  ? De Quervain's disease (radial styloid tenosynovitis) 06/26/2021  ? Osteoarthritis of carpometacarpal joints of thumbs, bilateral 12/20/2020  ? Benign prostatic hyperplasia with lower urinary tract symptoms 09/04/2020  ? Pure hypercholesterolemia 09/04/2020  ? Unilateral primary osteoarthritis, right knee 09/30/2018  ? Pain in right hip 05/24/2018  ? Chronic pain of left knee 05/24/2018  ? Osteoarthritis of acromioclavicular joint 04/05/2013  ? Impingement syndrome of right shoulder 04/05/2013  ? ?Past Medical History:  ?Diagnosis Date  ? Arthritis   ? Hyperlipemia   ? Medical history non-contributory   ? Wears glasses   ?  ?History reviewed. No pertinent family history.  ?Past Surgical History:  ?Procedure Laterality  Date  ? COLONOSCOPY    ? KNEE ARTHROSCOPY  9/12  ? left  ? KNEE ARTHROSCOPY    ? SHOULDER ARTHROSCOPY    ? SHOULDER ARTHROSCOPY WITH OPEN ROTATOR CUFF REPAIR AND DISTAL CLAVICLE ACROMINECTOMY Right 04/05/2013  ? Procedure: SHOULDER ARTHROSCOPIC DEBRIDEMENT, ACROMIOPLASTY, DISTAL CLAVICLE RESECTION, POSSIBLE MINI OPEN ROTATOR CUFF REPAIR.  ;  Surgeon: Garald Balding, MD;  Location: [facility];  Service: Orthopedics;  Laterality: Right;  ARTHROSCOPIC DEBRIDEMENT SHOULDER, ACORMIOPLASTY, DISTAL CLAVICLE RESECTION, POSSIBLE MINI OPEN ROTATOR CUFF REPAIR.    ? ?Social History  ? ?Occupational History  ? Not on file  ?Tobacco Use  ? Smoking status: Never  ? Smokeless tobacco: Never  ?Vaping Use  ? Vaping Use: Never used  ?Substance and Sexual Activity  ? Alcohol use: Yes  ?  Comment: occ  ? Drug use: No  ? Sexual activity: Not on file  ? ? ? ? ? ? ?

## 2021-12-25 ENCOUNTER — Ambulatory Visit: Payer: 59 | Admitting: Orthopaedic Surgery

## 2021-12-25 ENCOUNTER — Encounter: Payer: Self-pay | Admitting: Orthopaedic Surgery

## 2021-12-25 DIAGNOSIS — M170 Bilateral primary osteoarthritis of knee: Secondary | ICD-10-CM | POA: Diagnosis not present

## 2021-12-25 DIAGNOSIS — M1712 Unilateral primary osteoarthritis, left knee: Secondary | ICD-10-CM

## 2021-12-25 DIAGNOSIS — M180 Bilateral primary osteoarthritis of first carpometacarpal joints: Secondary | ICD-10-CM | POA: Diagnosis not present

## 2021-12-25 MED ORDER — METHYLPREDNISOLONE ACETATE 40 MG/ML IJ SUSP
20.0000 mg | INTRAMUSCULAR | Status: AC | PRN
  Administered 2021-12-25: 20 mg

## 2021-12-25 MED ORDER — LIDOCAINE HCL 1 % IJ SOLN
1.0000 mL | INTRAMUSCULAR | Status: AC | PRN
  Administered 2021-12-25: 1 mL

## 2021-12-25 MED ORDER — BUPIVACAINE HCL 0.25 % IJ SOLN
2.0000 mL | INTRAMUSCULAR | Status: AC | PRN
  Administered 2021-12-25: 2 mL via INTRA_ARTICULAR

## 2021-12-25 MED ORDER — LIDOCAINE HCL 1 % IJ SOLN
2.0000 mL | INTRAMUSCULAR | Status: AC | PRN
  Administered 2021-12-25: 2 mL

## 2021-12-25 MED ORDER — METHYLPREDNISOLONE ACETATE 40 MG/ML IJ SUSP
80.0000 mg | INTRAMUSCULAR | Status: AC | PRN
  Administered 2021-12-25: 80 mg via INTRA_ARTICULAR

## 2021-12-25 NOTE — Progress Notes (Signed)
Office Visit Note   Patient: Michael [name]           Date of Birth: 19-Jan-1956           MRN: [id number] Visit Date: 12/25/2021              Requested by: Shirline Frees, MD [address] [place],  [place] [address] PCP: Shirline Frees, MD   Assessment & Plan: Visit Diagnoses:  1. Primary osteoarthritis of both first carpometacarpal joints   2. Bilateral primary osteoarthritis of knee     Plan: Michael [name] has been previously diagnosed with arthritis at the base of both thumbs i.e. carpometacarpal joints.  Has had exacerbation of the pain on the left nondominant side and wishes to have a cortisone injection.  He has a minimally positive grind test but also has some discomfort along the first dorsal extensor compartment.  I will inject the basilar thumb joint but if he continues to have trouble over the next several weeks consider injecting the first dorsal extensor compartment.  Also having an exacerbation of his left knee pain.  He has had several prior films demonstrating progressive collapse of the medial compartment.  He had knee arthroscopy years ago for tear of the medial meniscus but did have some degenerative change at the time.  We will inject his knee with cortisone and monitor his response  Follow-Up Instructions: Return if symptoms worsen or fail to improve.   Orders:  No orders of the defined types were placed in this encounter.  No orders of the defined types were placed in this encounter.     Procedures: Large Joint Inj: L knee on 12/25/2021 4:39 PM Indications: pain and diagnostic evaluation Details: 25 G 1.5 in needle, anteromedial approach  Arthrogram: No  Medications: 2 mL lidocaine 1 %; 80 mg methylPREDNISolone acetate 40 MG/ML; 2 mL bupivacaine 0.25 % Procedure, treatment alternatives, risks and benefits explained, specific risks discussed. Consent was given by the patient. Patient was prepped and draped in the usual sterile fashion.    Hand/UE  Inj: L thumb [facility] for osteoarthritis on 12/25/2021 4:40 PM Medications: 1 mL lidocaine 1 %; 20 mg methylPREDNISolone acetate 40 MG/ML      Clinical Data: No additional findings.   Subjective: Chief Complaint  Patient presents with   Left Wrist - Pain   Left Knee - Pain  Patient presents today for left wrist and left knee pain. He received a cortisone injection in March at his left wrist. He said that the injection did not help at all. He continues to have pain. He also states that he has been seen for his left knee in the past and continues to have ongoing pain. Most of his pain is located medially, along with swelling posteriorly. He takes Aleve as needed. He is right hand dominant.   HPI  Review of Systems   Objective: Vital Signs: There were no vitals taken for this visit.  Physical Exam Constitutional:      Appearance: He is well-developed.  Eyes:     Pupils: Pupils are equal, round, and reactive to light.  Pulmonary:     Effort: Pulmonary effort is normal.  Skin:    General: Skin is warm and dry.  Neurological:     Mental Status: He is alert and oriented to person, place, and time.  Psychiatric:        Behavior: Behavior normal.     Ortho Exam awake alert and oriented x3.  Comfortable sitting.  Positive grind test with pain at the base of the left thumb carpometacarpal joint.  There is some mild swelling in the same area.  Neurologically intact.  Very minimal subluxation of the joint  Does have some discomfort in the first dorsal extensor compartment at the same wrist with positive Finkelstein's test.  He was more symptomatic at the base of the thumb than the wrist.  Minimal effusion left knee with tenderness on the medial compartment full extension flexed over 100 degrees without instability.  Some minimal popliteal pain but without any mass formation.  Sensation intact distally  Specialty Comments:  No specialty comments available.  Imaging: No results  found.   PMFS History: Patient Active Problem List   Diagnosis Date Noted   Bilateral primary osteoarthritis of knee 12/25/2021   Harriet Pho disease (radial styloid tenosynovitis) 06/26/2021   Osteoarthritis of carpometacarpal joints of thumbs, bilateral 12/20/2020   Benign prostatic hyperplasia with lower urinary tract symptoms 09/04/2020   Pure hypercholesterolemia 09/04/2020   Unilateral primary osteoarthritis, right knee 09/30/2018   Pain in right hip 05/24/2018   Chronic pain of left knee 05/24/2018   Osteoarthritis of acromioclavicular joint 04/05/2013   Impingement syndrome of right shoulder 04/05/2013   Past Medical History:  Diagnosis Date   Arthritis    Hyperlipemia    Medical history non-contributory    Wears glasses     History reviewed. No pertinent family history.  Past Surgical History:  Procedure Laterality Date   COLONOSCOPY     KNEE ARTHROSCOPY  9/12   left   KNEE ARTHROSCOPY     SHOULDER ARTHROSCOPY     SHOULDER ARTHROSCOPY WITH OPEN ROTATOR CUFF REPAIR AND DISTAL CLAVICLE ACROMINECTOMY Right 04/05/2013   Procedure: SHOULDER ARTHROSCOPIC DEBRIDEMENT, ACROMIOPLASTY, DISTAL CLAVICLE RESECTION, POSSIBLE MINI OPEN ROTATOR CUFF REPAIR.  ;  Surgeon: Garald Balding, MD;  Location: [facility];  Service: Orthopedics;  Laterality: Right;  ARTHROSCOPIC DEBRIDEMENT SHOULDER, ACORMIOPLASTY, DISTAL CLAVICLE RESECTION, POSSIBLE MINI OPEN ROTATOR CUFF REPAIR.     Social History   Occupational History   Not on file  Tobacco Use   Smoking status: Never   Smokeless tobacco: Never  Vaping Use   Vaping Use: Never used  Substance and Sexual Activity   Alcohol use: Yes    Comment: occ   Drug use: No   Sexual activity: Not on file

## 2022-03-04 ENCOUNTER — Ambulatory Visit: Payer: 59 | Admitting: Orthopaedic Surgery

## 2022-03-04 ENCOUNTER — Ambulatory Visit: Payer: Self-pay

## 2022-03-04 ENCOUNTER — Ambulatory Visit (INDEPENDENT_AMBULATORY_CARE_PROVIDER_SITE_OTHER): Payer: 59

## 2022-03-04 ENCOUNTER — Encounter: Payer: Self-pay | Admitting: Orthopaedic Surgery

## 2022-03-04 ENCOUNTER — Telehealth: Payer: Self-pay | Admitting: Radiology

## 2022-03-04 DIAGNOSIS — M1711 Unilateral primary osteoarthritis, right knee: Secondary | ICD-10-CM | POA: Diagnosis not present

## 2022-03-04 DIAGNOSIS — M1712 Unilateral primary osteoarthritis, left knee: Secondary | ICD-10-CM

## 2022-03-04 DIAGNOSIS — M170 Bilateral primary osteoarthritis of knee: Secondary | ICD-10-CM

## 2022-03-04 MED ORDER — BUPIVACAINE HCL 0.25 % IJ SOLN
2.0000 mL | INTRAMUSCULAR | Status: AC | PRN
  Administered 2022-03-04: 2 mL via INTRA_ARTICULAR

## 2022-03-04 MED ORDER — LIDOCAINE HCL 1 % IJ SOLN
2.0000 mL | INTRAMUSCULAR | Status: AC | PRN
  Administered 2022-03-04: 2 mL

## 2022-03-04 MED ORDER — METHYLPREDNISOLONE ACETATE 40 MG/ML IJ SUSP
80.0000 mg | INTRAMUSCULAR | Status: AC | PRN
  Administered 2022-03-04: 80 mg via INTRA_ARTICULAR

## 2022-03-04 NOTE — Telephone Encounter (Signed)
Dr. Durward Fortes would like to get Michael [name] authorization for Gel injection for his left knee, and he would like for him to get it before [employer]. Thank you.

## 2022-03-04 NOTE — Progress Notes (Signed)
Office Visit Note   Patient: Michael [name]           Date of Birth: 22-Oct-1955           MRN: [id number] Visit Date: 03/04/2022              Requested by: Shirline Frees, MD [address] [place],  [place] [address] PCP: Shirline Frees, MD   Assessment & Plan: Visit Diagnoses:  1. Bilateral primary osteoarthritis of knee     Plan: Mr. Tierce has progressive osteoarthritis of both knees predominantly in the medial compartments.  X-rays of both knees standing today were compared to films from several years ago demonstrating progressive loss of the medial compartment joint space associated with peripheral osteophytes and subchondral sclerosis.  Long discussion regarding his diagnosis and treatment options.  He would like to try a cortisone injection in the left knee and get precertified for viscosupplementation as he is more symptomatic on the left.  He has had reasonably good results for years ago for viscosupplementation on the right.  Follow-Up Instructions: Return Pre-CERT viscosupplementation.   Orders:  Orders Placed This Encounter  Procedures   XR KNEE 3 VIEW RIGHT   XR KNEE 3 VIEW LEFT   No orders of the defined types were placed in this encounter.     Procedures: Large Joint Inj: L knee on 03/04/2022 5:53 PM Indications: pain and diagnostic evaluation Details: 25 G 1.5 in needle, anteromedial approach  Arthrogram: No  Medications: 2 mL lidocaine 1 %; 80 mg methylPREDNISolone acetate 40 MG/ML; 2 mL bupivacaine 0.25 % Procedure, treatment alternatives, risks and benefits explained, specific risks discussed. Consent was given by the patient. Patient was prepped and draped in the usual sterile fashion.       Clinical Data: No additional findings.   Subjective: Chief Complaint  Patient presents with   Left Knee - Pain, Follow-up  Recurrent and more recently persistent achiness and soreness of both of his knees.  Previously diagnosed with osteoarthritis  by film several years ago.  Has had bilateral knee arthroscopy in the past.  Has more difficulty standing with occasional swelling  HPI  Review of Systems   Objective: Vital Signs: There were no vitals taken for this visit.  Physical Exam Constitutional:      Appearance: He is well-developed.  Eyes:     Pupils: Pupils are equal, round, and reactive to light.  Pulmonary:     Effort: Pulmonary effort is normal.  Skin:    General: Skin is warm and dry.  Neurological:     Mental Status: He is alert and oriented to person, place, and time.  Psychiatric:        Behavior: Behavior normal.     Ortho Exam knees were not hot red or swollen.  Might have a very minimal effusion on the left and none on the right.  Full quick extension.  Little bit of thickness of the skin over the anterior aspect of the knee consistent with his work with persistent kneeling.  A little bit of patella crepitation but no pain with compression.  Slight increased varus bilaterally.  Minimal medial joint pain bilaterally.  No popliteal pain.  No calf pain.  Walks without a limp.  No pain with range of motion of either hip  Specialty Comments:  No specialty comments available.  Imaging: XR KNEE 3 VIEW LEFT  Result Date: 03/04/2022 Films of the left knee were obtained in 3 projections standing and  compared to prior films of several years ago.  These also demonstrate progressive narrowing of the medial compartment associated with subchondral sclerosis and peripheral osteophytes.  Acute changes or ectopic calcification.  XR KNEE 3 VIEW RIGHT  Result Date: 03/04/2022 Films of the right knee were obtained in 3 projections standing and compared to films from several years ago.  There is more narrowing of the medial compartment with small peripheral osteophytes and subchondral sclerosis.  There is also increased valgus.  There are degenerative changes in the lateral and patellofemoral compartments to a lesser extent.   Films are consistent with advanced osteoarthritis without acute change    PMFS History: Patient Active Problem List   Diagnosis Date Noted   Bilateral primary osteoarthritis of knee 12/25/2021   Harriet Pho disease (radial styloid tenosynovitis) 06/26/2021   Osteoarthritis of carpometacarpal joints of thumbs, bilateral 12/20/2020   Benign prostatic hyperplasia with lower urinary tract symptoms 09/04/2020   Pure hypercholesterolemia 09/04/2020   Unilateral primary osteoarthritis, right knee 09/30/2018   Pain in right hip 05/24/2018   Chronic pain of left knee 05/24/2018   Osteoarthritis of acromioclavicular joint 04/05/2013   Impingement syndrome of right shoulder 04/05/2013   Past Medical History:  Diagnosis Date   Arthritis    Hyperlipemia    Medical history non-contributory    Wears glasses     History reviewed. No pertinent family history.  Past Surgical History:  Procedure Laterality Date   COLONOSCOPY     KNEE ARTHROSCOPY  9/12   left   KNEE ARTHROSCOPY     SHOULDER ARTHROSCOPY     SHOULDER ARTHROSCOPY WITH OPEN ROTATOR CUFF REPAIR AND DISTAL CLAVICLE ACROMINECTOMY Right 04/05/2013   Procedure: SHOULDER ARTHROSCOPIC DEBRIDEMENT, ACROMIOPLASTY, DISTAL CLAVICLE RESECTION, POSSIBLE MINI OPEN ROTATOR CUFF REPAIR.  ;  Surgeon: Garald Balding, MD;  Location: [facility];  Service: Orthopedics;  Laterality: Right;  ARTHROSCOPIC DEBRIDEMENT SHOULDER, ACORMIOPLASTY, DISTAL CLAVICLE RESECTION, POSSIBLE MINI OPEN ROTATOR CUFF REPAIR.     Social History   Occupational History   Not on file  Tobacco Use   Smoking status: Never   Smokeless tobacco: Never  Vaping Use   Vaping Use: Never used  Substance and Sexual Activity   Alcohol use: Yes    Comment: occ   Drug use: No   Sexual activity: Not on file     Garald Balding, MD   Note - This record has been created using Editor, commissioning.  Chart creation errors have been sought, but may not always  have  been located. Such creation errors do not reflect on  the standard of medical care.

## 2022-03-05 NOTE — Telephone Encounter (Signed)
VOB submitted for Euflexxa, left knee

## 2022-03-06 ENCOUNTER — Other Ambulatory Visit: Payer: Self-pay

## 2022-03-06 DIAGNOSIS — M170 Bilateral primary osteoarthritis of knee: Secondary | ICD-10-CM

## 2022-03-13 ENCOUNTER — Encounter: Payer: Self-pay | Admitting: Orthopaedic Surgery

## 2022-03-13 ENCOUNTER — Ambulatory Visit: Payer: 59 | Admitting: Orthopaedic Surgery

## 2022-03-13 DIAGNOSIS — G8929 Other chronic pain: Secondary | ICD-10-CM

## 2022-03-13 DIAGNOSIS — M1712 Unilateral primary osteoarthritis, left knee: Secondary | ICD-10-CM | POA: Diagnosis not present

## 2022-03-13 DIAGNOSIS — M25562 Pain in left knee: Secondary | ICD-10-CM

## 2022-03-13 MED ORDER — LIDOCAINE HCL 1 % IJ SOLN
3.0000 mL | INTRAMUSCULAR | Status: AC | PRN
  Administered 2022-03-13: 3 mL

## 2022-03-13 MED ORDER — SODIUM HYALURONATE (VISCOSUP) 20 MG/2ML IX SOSY
20.0000 mg | PREFILLED_SYRINGE | INTRA_ARTICULAR | Status: AC | PRN
  Administered 2022-03-13: 20 mg via INTRA_ARTICULAR

## 2022-03-13 NOTE — Progress Notes (Signed)
Office Visit Note   Patient: Michael [name]           Date of Birth: [birth year]           MRN: [id number] Visit Date: 03/13/2022              Requested by: Shirline Frees, MD [place],  [place] [address] PCP: Shirline Frees, MD  No chief complaint on file.     HPI: Patient is a pleasant 66 year old gentleman who comes in for his first injection of Euflexxa into his left knee.  He is requesting his knee be anesthetized with Xylocaine prior to the injection  Assessment & Plan: Visit Diagnoses:  1. Chronic pain of left knee     Plan: Tolerated the injection well we will follow-up in 1 week for second injection we should plan on injecting 3 cc of lidocaine prior to the Euflexxa  Follow-Up Instructions: Return in about 1 week (around 03/20/2022).   Ortho Exam  Patient is alert, oriented, no adenopathy, well-dressed, normal affect, normal respiratory effort. Examination of his left knee he has no effusion he has no redness compartments are soft and nontender he is neurovascularly intact  Imaging: No results found. No images are attached to the encounter.  Labs: No results found for: "HGBA1C", "ESRSEDRATE", "CRP", "LABURIC", "REPTSTATUS", "GRAMSTAIN", "CULT", "LABORGA"   No results found for: "ALBUMIN", "PREALBUMIN", "CBC"  No results found for: "MG" No results found for: "VD25OH"  No results found for: "PREALBUMIN"    Latest Ref Rng & Units 04/05/2013   12:13 PM  CBC EXTENDED  Hemoglobin 13.0 - 17.0 g/dL 13.9      There is no height or weight on file to calculate BMI.  Orders:  No orders of the defined types were placed in this encounter.  No orders of the defined types were placed in this encounter.    Procedures: Large Joint Inj: L knee on 03/13/2022 4:23 PM Indications: pain and diagnostic evaluation Details: 25 G 1.5 in needle, anteromedial approach  Arthrogram: No  Medications: 3 mL lidocaine 1 %; 20 mg Sodium Hyaluronate  (Viscosup) 20 MG/2ML Outcome: tolerated well, no immediate complications Procedure, treatment alternatives, risks and benefits explained, specific risks discussed. Consent was given by the patient. Immediately prior to procedure a time out was called to verify the correct patient, procedure, equipment, support staff and site/side marked as required. Patient was prepped and draped in the usual sterile fashion.    Clinical Data: No additional findings.  ROS:  All other systems negative, except as noted in the HPI. Review of Systems  Objective: Vital Signs: There were no vitals taken for this visit.  Specialty Comments:  No specialty comments available.  PMFS History: Patient Active Problem List   Diagnosis Date Noted   Bilateral primary osteoarthritis of knee 12/25/2021   Harriet Pho disease (radial styloid tenosynovitis) 06/26/2021   Osteoarthritis of carpometacarpal joints of thumbs, bilateral 12/20/2020   Benign prostatic hyperplasia with lower urinary tract symptoms 09/04/2020   Pure hypercholesterolemia 09/04/2020   Unilateral primary osteoarthritis, right knee 09/30/2018   Pain in right hip 05/24/2018   Chronic pain of left knee 05/24/2018   Osteoarthritis of acromioclavicular joint 04/05/2013   Impingement syndrome of right shoulder 04/05/2013   Past Medical History:  Diagnosis Date   Arthritis    Hyperlipemia    Medical history non-contributory    Wears glasses     History reviewed. No pertinent family history.  Past Surgical  History:  Procedure Laterality Date   COLONOSCOPY     KNEE ARTHROSCOPY  9/12   left   KNEE ARTHROSCOPY     SHOULDER ARTHROSCOPY     SHOULDER ARTHROSCOPY WITH OPEN ROTATOR CUFF REPAIR AND DISTAL CLAVICLE ACROMINECTOMY Right 04/05/2013   Procedure: SHOULDER ARTHROSCOPIC DEBRIDEMENT, ACROMIOPLASTY, DISTAL CLAVICLE RESECTION, POSSIBLE MINI OPEN ROTATOR CUFF REPAIR.  ;  Surgeon: Garald Balding, MD;  Location: [facility];   Service: Orthopedics;  Laterality: Right;  ARTHROSCOPIC DEBRIDEMENT SHOULDER, ACORMIOPLASTY, DISTAL CLAVICLE RESECTION, POSSIBLE MINI OPEN ROTATOR CUFF REPAIR.     Social History   Occupational History   Not on file  Tobacco Use   Smoking status: Never   Smokeless tobacco: Never  Vaping Use   Vaping Use: Never used  Substance and Sexual Activity   Alcohol use: Yes    Comment: occ   Drug use: No   Sexual activity: Not on file

## 2022-03-20 ENCOUNTER — Ambulatory Visit: Payer: 59 | Admitting: Orthopaedic Surgery

## 2022-03-20 ENCOUNTER — Encounter: Payer: Self-pay | Admitting: Orthopaedic Surgery

## 2022-03-20 DIAGNOSIS — M25562 Pain in left knee: Secondary | ICD-10-CM

## 2022-03-20 DIAGNOSIS — M1712 Unilateral primary osteoarthritis, left knee: Secondary | ICD-10-CM

## 2022-03-20 DIAGNOSIS — G8929 Other chronic pain: Secondary | ICD-10-CM | POA: Diagnosis not present

## 2022-03-20 MED ORDER — SODIUM HYALURONATE (VISCOSUP) 20 MG/2ML IX SOSY
20.0000 mg | PREFILLED_SYRINGE | INTRA_ARTICULAR | Status: AC | PRN
  Administered 2022-03-20: 20 mg via INTRA_ARTICULAR

## 2022-03-20 NOTE — Progress Notes (Signed)
Office Visit Note   Patient: Michael [name]           Date of Birth: 28-Mar-1956           MRN: [id number] Visit Date: 03/20/2022              Requested by: Shirline Frees, MD [address],  [place] [address] PCP: Shirline Frees, MD   Assessment & Plan: Visit Diagnoses:  1. Chronic pain of left knee     Plan: Xylan comes in today for his second Euflexxa injection into his left knee.  He already feels much better.  Denies any side effects.  He does much better with 3 cc of lidocaine followed by the injection.  Tolerated the injection well today we will follow-up in 1 week for his final injection  Follow-Up Instructions: 1 week  Orders:  No orders of the defined types were placed in this encounter.  No orders of the defined types were placed in this encounter.     Procedures: Large Joint Inj: L knee on 03/20/2022 2:05 PM Indications: pain and diagnostic evaluation Details: 22 G 1.5 in needle  Arthrogram: No  Medications: 20 mg Sodium Hyaluronate (Viscosup) 20 MG/2ML Outcome: tolerated well, no immediate complications Procedure, treatment alternatives, risks and benefits explained, specific risks discussed. Consent was given by the patient.     Clinical Data: No additional findings.   Subjective: Chief Complaint  Patient presents with  . Left Knee - Follow-up    Euflexxa #2  Patient presents today for the second Euflexxa injection in his left knee.    Review of Systems  All other systems reviewed and are negative.    Objective: Vital Signs: There were no vitals taken for this visit.  Physical Exam Constitutional:      Appearance: Normal appearance.  Pulmonary:     Effort: Pulmonary effort is normal.  Neurological:     Mental Status: He is alert.    Ortho Exam Examination of his left knee has no erythema no effusion no swelling.  Compartments are soft nontender he is neurovascularly intact Specialty Comments:  No specialty  comments available.  Imaging: No results found.   PMFS History: Patient Active Problem List   Diagnosis Date Noted  . Bilateral primary osteoarthritis of knee 12/25/2021  . De Quervain's disease (radial styloid tenosynovitis) 06/26/2021  . Osteoarthritis of carpometacarpal joints of thumbs, bilateral 12/20/2020  . Benign prostatic hyperplasia with lower urinary tract symptoms 09/04/2020  . Pure hypercholesterolemia 09/04/2020  . Unilateral primary osteoarthritis, right knee 09/30/2018  . Pain in right hip 05/24/2018  . Chronic pain of left knee 05/24/2018  . Osteoarthritis of acromioclavicular joint 04/05/2013  . Impingement syndrome of right shoulder 04/05/2013   Past Medical History:  Diagnosis Date  . Arthritis   . Hyperlipemia   . Medical history non-contributory   . Wears glasses     History reviewed. No pertinent family history.  Past Surgical History:  Procedure Laterality Date  . COLONOSCOPY    . KNEE ARTHROSCOPY  9/12   left  . KNEE ARTHROSCOPY    . SHOULDER ARTHROSCOPY    . SHOULDER ARTHROSCOPY WITH OPEN ROTATOR CUFF REPAIR AND DISTAL CLAVICLE ACROMINECTOMY Right 04/05/2013   Procedure: SHOULDER ARTHROSCOPIC DEBRIDEMENT, ACROMIOPLASTY, DISTAL CLAVICLE RESECTION, POSSIBLE MINI OPEN ROTATOR CUFF REPAIR.  ;  Surgeon: Garald Balding, MD;  Location: [facility];  Service: Orthopedics;  Laterality: Right;  ARTHROSCOPIC DEBRIDEMENT SHOULDER, ACORMIOPLASTY, DISTAL CLAVICLE RESECTION,  POSSIBLE MINI OPEN ROTATOR CUFF REPAIR.     Social History   Occupational History  . Not on file  Tobacco Use  . Smoking status: Never  . Smokeless tobacco: Never  Vaping Use  . Vaping Use: Never used  Substance and Sexual Activity  . Alcohol use: Yes    Comment: occ  . Drug use: No  . Sexual activity: Not on file

## 2022-03-27 ENCOUNTER — Encounter: Payer: Self-pay | Admitting: Orthopaedic Surgery

## 2022-03-27 ENCOUNTER — Ambulatory Visit: Payer: 59 | Admitting: Orthopaedic Surgery

## 2022-03-27 DIAGNOSIS — M25562 Pain in left knee: Secondary | ICD-10-CM

## 2022-03-27 DIAGNOSIS — G8929 Other chronic pain: Secondary | ICD-10-CM

## 2022-03-27 DIAGNOSIS — M1712 Unilateral primary osteoarthritis, left knee: Secondary | ICD-10-CM

## 2022-03-27 MED ORDER — SODIUM HYALURONATE (VISCOSUP) 20 MG/2ML IX SOSY
20.0000 mg | PREFILLED_SYRINGE | INTRA_ARTICULAR | Status: AC | PRN
  Administered 2022-03-27: 20 mg via INTRA_ARTICULAR

## 2022-03-27 MED ORDER — METHYLPREDNISOLONE ACETATE 40 MG/ML IJ SUSP
40.0000 mg | INTRAMUSCULAR | Status: AC | PRN
  Administered 2022-03-27: 40 mg via INTRA_ARTICULAR

## 2022-03-27 NOTE — Progress Notes (Signed)
Office Visit Note   Patient: Michael [name]           Date of Birth: Oct 31, 1955           MRN: [id number] Visit Date: 03/27/2022              Requested by: Shirline Frees, MD [place] [place],  [place] [address] PCP: Shirline Frees, MD   Assessment & Plan: Visit Diagnoses:  1. Chronic pain of left knee     Plan:  Patient is here for his third and final Euflexxa injection.  He does feel like he is gotten good relief.  May follow-up as needed.  Understands he could get a steroid injection if he did need to.  Injection was done without difficulty Follow-Up Instructions: Return if symptoms worsen or fail to improve.   Orders:  No orders of the defined types were placed in this encounter.  No orders of the defined types were placed in this encounter.     Procedures: Large Joint Inj on 03/27/2022 1:25 PM Indications: pain and diagnostic evaluation Details: 22 G 1.5 in needle, anteromedial approach  Arthrogram: No  Medications: 40 mg methylPREDNISolone acetate 40 MG/ML; 20 mg Sodium Hyaluronate (Viscosup) 20 MG/2ML Outcome: tolerated well, no immediate complications Procedure, treatment alternatives, risks and benefits explained, specific risks discussed. Consent was given by the patient.     Clinical Data: No additional findings.   Subjective: Chief Complaint  Patient presents with   Left Knee - Pain    Euflexxa #3  Patient returns for third Euflexxa injection. He states that he has gotten 60% relief from the previous two injections. He is doing well.    Review of Systems  All other systems reviewed and are negative.    Objective: Vital Signs: There were no vitals taken for this visit.  Physical Exam Constitutional:      Appearance: Normal appearance.  Pulmonary:     Effort: Pulmonary effort is normal.  Neurological:     Mental Status: He is alert.     Ortho Exam Examination of his left knee no effusion no erythema no redness  compartments are soft and nontender Specialty Comments:  No specialty comments available.  Imaging: No results found.   PMFS History: Patient Active Problem List   Diagnosis Date Noted   Bilateral primary osteoarthritis of knee 12/25/2021   Harriet Pho disease (radial styloid tenosynovitis) 06/26/2021   Osteoarthritis of carpometacarpal joints of thumbs, bilateral 12/20/2020   Benign prostatic hyperplasia with lower urinary tract symptoms 09/04/2020   Pure hypercholesterolemia 09/04/2020   Unilateral primary osteoarthritis, right knee 09/30/2018   Pain in right hip 05/24/2018   Chronic pain of left knee 05/24/2018   Osteoarthritis of acromioclavicular joint 04/05/2013   Impingement syndrome of right shoulder 04/05/2013   Past Medical History:  Diagnosis Date   Arthritis    Hyperlipemia    Medical history non-contributory    Wears glasses     History reviewed. No pertinent family history.  Past Surgical History:  Procedure Laterality Date   COLONOSCOPY     KNEE ARTHROSCOPY  9/12   left   KNEE ARTHROSCOPY     SHOULDER ARTHROSCOPY     SHOULDER ARTHROSCOPY WITH OPEN ROTATOR CUFF REPAIR AND DISTAL CLAVICLE ACROMINECTOMY Right 04/05/2013   Procedure: SHOULDER ARTHROSCOPIC DEBRIDEMENT, ACROMIOPLASTY, DISTAL CLAVICLE RESECTION, POSSIBLE MINI OPEN ROTATOR CUFF REPAIR.  ;  Surgeon: Garald Balding, MD;  Location: [facility];  Service: Orthopedics;  Laterality: Right;  ARTHROSCOPIC DEBRIDEMENT SHOULDER, ACORMIOPLASTY, DISTAL CLAVICLE RESECTION, POSSIBLE MINI OPEN ROTATOR CUFF REPAIR.     Social History   Occupational History   Not on file  Tobacco Use   Smoking status: Never   Smokeless tobacco: Never  Vaping Use   Vaping Use: Never used  Substance and Sexual Activity   Alcohol use: Yes    Comment: occ   Drug use: No   Sexual activity: Not on file

## 2022-12-23 ENCOUNTER — Ambulatory Visit: Payer: 59 | Admitting: Podiatry

## 2022-12-23 ENCOUNTER — Ambulatory Visit (INDEPENDENT_AMBULATORY_CARE_PROVIDER_SITE_OTHER): Payer: 59

## 2022-12-23 ENCOUNTER — Encounter: Payer: Self-pay | Admitting: Podiatry

## 2022-12-23 DIAGNOSIS — S9031XA Contusion of right foot, initial encounter: Secondary | ICD-10-CM | POA: Diagnosis not present

## 2022-12-23 DIAGNOSIS — S92514A Nondisplaced fracture of proximal phalanx of right lesser toe(s), initial encounter for closed fracture: Secondary | ICD-10-CM | POA: Diagnosis not present

## 2022-12-24 NOTE — Progress Notes (Signed)
He presents today chief complaint of pain to his toes on his right foot.  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  Objective: Vital signs stable alert oriented x 3 right foot does demonstrate some edema of the fourth toe.  Some tenderness on palpation of the toe radiographs demonstrate an oblique fracture of the proximal phalanx of the fourth toe nondisplaced none comminuted.  Assessment: Fractured fourth digit right.  Plan: Demonstrated to him how to wrap the toe on a regular basis and provided him with Coban to do such and I will follow-up with him in 4 weeks for x-rays if necessary.

## 2023-03-27 ENCOUNTER — Encounter: Payer: Self-pay | Admitting: Physician Assistant

## 2023-03-27 ENCOUNTER — Ambulatory Visit: Payer: 59 | Admitting: Physician Assistant

## 2023-03-27 DIAGNOSIS — M1711 Unilateral primary osteoarthritis, right knee: Secondary | ICD-10-CM

## 2023-03-27 DIAGNOSIS — M25532 Pain in left wrist: Secondary | ICD-10-CM | POA: Diagnosis not present

## 2023-03-27 DIAGNOSIS — M170 Bilateral primary osteoarthritis of knee: Secondary | ICD-10-CM

## 2023-03-27 MED ORDER — METHYLPREDNISOLONE ACETATE 40 MG/ML IJ SUSP
40.0000 mg | INTRAMUSCULAR | Status: AC | PRN
  Administered 2023-03-27: 40 mg via INTRA_ARTICULAR

## 2023-03-27 MED ORDER — LIDOCAINE HCL 1 % IJ SOLN
3.0000 mL | INTRAMUSCULAR | Status: AC | PRN
  Administered 2023-03-27: 3 mL

## 2023-03-27 NOTE — Progress Notes (Signed)
Office Visit Note   Patient: Michael [name]           Date of Birth: 1955/07/08           MRN: [id number] Visit Date: 03/27/2023              Requested by: Noberto Retort, MD [phone] [address] [place],  [place] [address] PCP: Noberto Retort, MD  Chief Complaint  Patient presents with  . Right Knee - Pain  . Left Wrist - Pain      HPI: Michael [name] comes in today he is a patient of Dr. Cleophas Dunker.  He has had bilateral osteoarthritis of his knees.  He has done well with Euflexxa injections in the past.  He comes in today complaining of right medial knee pain.  Also has chronic left wrist and first dorsal compartment pain as well as arthritis in his left thumb he said he has had injections to these but they have not really helped  Assessment & Plan: Visit Diagnoses:  1. Pain in left wrist     Plan: Will go forward with right knee injection today we will follow-up and make referral to Dr. Fara Boros to discuss options for his left wrist  Follow-Up Instructions: No follow-ups on file.   Ortho Exam  Patient is alert, oriented, no adenopathy, well-dressed, normal affect, normal respiratory effort. Right knee he has grinding no effusion no erythema compartments are soft and compressible he is neurovascularly intact.  He has grinding with range of motion.  More pain medially with a varus stance  Imaging: No results found. No images are attached to the encounter.  Labs: No results found for: "HGBA1C", "ESRSEDRATE", "CRP", "LABURIC", "REPTSTATUS", "GRAMSTAIN", "CULT", "LABORGA"   No results found for: "ALBUMIN", "PREALBUMIN", "CBC"  No results found for: "MG" No results found for: "VD25OH"  No results found for: "PREALBUMIN"    Latest Ref Rng & Units 04/05/2013   12:13 PM  CBC EXTENDED  Hemoglobin 13.0 - 17.0 g/dL 08.6      There is no height or weight on file to calculate BMI.  Orders:  Orders Placed This Encounter  Procedures  . Ambulatory referral to Orthopedic  Surgery   No orders of the defined types were placed in this encounter.    Procedures: Large Joint Inj: R knee on 03/27/2023 2:02 PM Indications: pain and diagnostic evaluation Details: 25 G 1.5 in needle, anteromedial approach  Arthrogram: No  Medications: 40 mg methylPREDNISolone acetate 40 MG/ML; 3 mL lidocaine 1 % Outcome: tolerated well, no immediate complications Procedure, treatment alternatives, risks and benefits explained, specific risks discussed. Consent was given by the patient.    Clinical Data: No additional findings.  ROS:  All other systems negative, except as noted in the HPI. Review of Systems  Objective: Vital Signs: There were no vitals taken for this visit.  Specialty Comments:  No specialty comments available.  PMFS History: Patient Active Problem List   Diagnosis Date Noted  . Bilateral primary osteoarthritis of knee 12/25/2021  . De Quervain's disease (radial styloid tenosynovitis) 06/26/2021  . Osteoarthritis of carpometacarpal joints of thumbs, bilateral 12/20/2020  . Benign prostatic hyperplasia with lower urinary tract symptoms 09/04/2020  . Pure hypercholesterolemia 09/04/2020  . Unilateral primary osteoarthritis, right knee 09/30/2018  . Pain in right hip 05/24/2018  . Chronic pain of left knee 05/24/2018  . Osteoarthritis of acromioclavicular joint 04/05/2013  . Impingement syndrome of right shoulder 04/05/2013   Past Medical History:  Diagnosis Date  . Arthritis   . Hyperlipemia   . Medical history non-contributory   . Wears glasses     No family history on file.  Past Surgical History:  Procedure Laterality Date  . COLONOSCOPY    . KNEE ARTHROSCOPY  9/12   left  . KNEE ARTHROSCOPY    . SHOULDER ARTHROSCOPY    . SHOULDER ARTHROSCOPY WITH OPEN ROTATOR CUFF REPAIR AND DISTAL CLAVICLE ACROMINECTOMY Right 04/05/2013   Procedure: SHOULDER ARTHROSCOPIC DEBRIDEMENT, ACROMIOPLASTY, DISTAL CLAVICLE RESECTION, POSSIBLE MINI OPEN  ROTATOR CUFF REPAIR.  ;  Surgeon: Valeria Batman, MD;  Location: [facility];  Service: Orthopedics;  Laterality: Right;  ARTHROSCOPIC DEBRIDEMENT SHOULDER, ACORMIOPLASTY, DISTAL CLAVICLE RESECTION, POSSIBLE MINI OPEN ROTATOR CUFF REPAIR.     Social History   Occupational History  . Not on file  Tobacco Use  . Smoking status: Never  . Smokeless tobacco: Never  Vaping Use  . Vaping status: Never Used  Substance and Sexual Activity  . Alcohol use: Yes    Comment: occ  . Drug use: No  . Sexual activity: Not on file

## 2023-05-06 ENCOUNTER — Other Ambulatory Visit (INDEPENDENT_AMBULATORY_CARE_PROVIDER_SITE_OTHER): Payer: 59

## 2023-05-06 ENCOUNTER — Ambulatory Visit (INDEPENDENT_AMBULATORY_CARE_PROVIDER_SITE_OTHER): Payer: 59 | Admitting: Orthopedic Surgery

## 2023-05-06 DIAGNOSIS — M654 Radial styloid tenosynovitis [de Quervain]: Secondary | ICD-10-CM

## 2023-05-06 DIAGNOSIS — M25532 Pain in left wrist: Secondary | ICD-10-CM

## 2023-05-06 DIAGNOSIS — M1812 Unilateral primary osteoarthritis of first carpometacarpal joint, left hand: Secondary | ICD-10-CM

## 2023-05-06 NOTE — Progress Notes (Signed)
 Michael [name] - 68 y.o. male MRN [id number]  Date of birth: [birth year]  Office Visit Note: Visit Date: 05/06/2023 PCP: Arloa Elsie JONELLE, MD Referred by: Persons, Ronal Dragon, PA  Subjective: No chief complaint on file.  HPI: Michael [name] is a pleasant 68 y.o. male who presents today for evaluation of ongoing left thumb CMC arthritis and prior de Quervain's tenosynovitis that has been managed conservatively.  He was seen by Dr. Anderson my partner who is since retired approximately 1 year prior and underwent injection to both the left thumb [facility] articulation and the first extensor compartment.  He got minimal relief from the prior injections.  States that his pain has significantly progressed, has notable basilar thumb pain with grip and pinch currently.  He is self-employed and overall healthy and active at baseline.  Denies any significant numbness or tingling.  Pertinent ROS were reviewed with the patient and found to be negative unless otherwise specified above in HPI.   Visit Reason: left thumb basal joint pain Duration of symptoms: 1+ year Hand dominance: right Occupation: Remodel Contractor Diabetic: No Smoking: No Heart/Lung History: none Blood Thinners: none  Prior Testing/EMG: none Injections (Date): 1+ year ago Treatments: none Prior Surgery: none   Assessment & Plan: Visit Diagnoses:  1. Pain in left wrist     Plan: Extensive discussion was had with the patient today regarding his ongoing left thumb CMC osteoarthritis with associated de Quervain's tenosynovitis.  X-rays were reviewed in detail today which do show significant degenerative change at the left thumb [facility] articulation.  We discussed treatment modalities ranging from conservative to surgical.  From a conservative standpoint we discussed bracing, injections, anti-inflammatory medications and activity modification.  From a surgical standpoint we discussed [facility] arthroplasty, risks and benefits as well as the  postoperative protocol.  At this juncture, given the severity of symptoms that have remained refractory to conservative care, patient would like to move forward with surgical intervention.  Given the significance of the degenerative change on x-ray which clinically correlates with examination, we can move forward with surgical scheduling of left thumb CMC arthroplasty and first extensor compartment release at the next available date.  Risks and benefits of the procedure were discussed, risks including but not limited to infection, bleeding, scarring, stiffness, nerve injury, tendon injury, vascular injury, hardware complication, recurrence of symptoms and need for subsequent operation.  We also discussed the specifics of the postoperative protocol and the appropriate timeline.  Patient expressed understanding.  He does have significant thumb MP arthritis as well seen radiographically, however clinically does not have significant pain at this region.  I did mention that in the future, should this become more symptomatic, consideration could be given to MP fusion.   Follow-up: No follow-ups on file.   Meds & Orders: No orders of the defined types were placed in this encounter.   Orders Placed This Encounter  Procedures   XR Wrist Complete Left     Procedures: No procedures performed      Clinical History: No specialty comments available.  He reports that he has never smoked. He has never used smokeless tobacco. No results for input(s): HGBA1C, LABURIC in the last 8760 hours.  Objective:   Vital Signs: There were no vitals taken for this visit.  Physical Exam  Gen: Well-appearing, in no acute distress; non-toxic CV: Regular Rate. Well-perfused. Warm.  Resp: Breathing unlabored on room air; no wheezing. Psych: Fluid speech in conversation; appropriate affect; normal thought process  Ortho Exam General: Patient is well appearing and in no distress. Cervical spine mobility is full  in all directions:   Skin and Muscle: No skin changes are apparent to upper extremities.  Muscle bulk and contour normal, no signs of atrophy.      Range of Motion and Palpation Tests: Mobility is full about the elbows with flexion and extension.  Forearm supination and pronation are 85/85 bilaterally.  Wrist flexion/extension is 75/65 bilaterally.  Digital flexion and extension are full.  Thumb opposition is full to the base of the small fingers bilaterally.     No cords or nodules are palpated.  No triggering is observed.     Significant tenderness over the left thumb CMC articulation is observed, positive grind for pain, positive crepitus.  MP hyperextension negative.   No significant pain at the MP region. Finklestein test positive   Neurologic, Vascular, Motor: Left Sensation is intact to light touch in the median/radial/ulnar distributions.  Tinel's testing negative at wrist level. Phalen's negative, Derkan's compression negative.  Fingers pink and well perfused.  Capillary refill is brisk.     Imaging: XR Wrist Complete Left Result Date: 05/06/2023 X-rays of the left wrist including dedicated pantrapezial view are obtained today X-rays demonstrate significant joint space narrowing at the thumb [facility] articulation with osteophyte formation and subchondral sclerosis.  There is also significant arthritic change seen at the thumb MCP interval.  Diffuse degenerative changes throughout the wrist is appreciated at the radiocarpal joint with ulnar positive variance.   Past Medical/Family/Surgical/Social History: Medications & Allergies reviewed per EMR, new medications updated. Patient Active Problem List   Diagnosis Date Noted   Bilateral primary osteoarthritis of knee 12/25/2021   Everitt Curt disease (radial styloid tenosynovitis) 06/26/2021   Osteoarthritis of carpometacarpal joints of thumbs, bilateral 12/20/2020   Benign prostatic hyperplasia with lower urinary tract symptoms  09/04/2020   Pure hypercholesterolemia 09/04/2020   Unilateral primary osteoarthritis, right knee 09/30/2018   Pain in right hip 05/24/2018   Chronic pain of left knee 05/24/2018   Osteoarthritis of acromioclavicular joint 04/05/2013   Impingement syndrome of right shoulder 04/05/2013   Past Medical History:  Diagnosis Date   Arthritis    Hyperlipemia    Medical history non-contributory    Wears glasses    No family history on file. Past Surgical History:  Procedure Laterality Date   COLONOSCOPY     KNEE ARTHROSCOPY  9/12   left   KNEE ARTHROSCOPY     SHOULDER ARTHROSCOPY     SHOULDER ARTHROSCOPY WITH OPEN ROTATOR CUFF REPAIR AND DISTAL CLAVICLE ACROMINECTOMY Right 04/05/2013   Procedure: SHOULDER ARTHROSCOPIC DEBRIDEMENT, ACROMIOPLASTY, DISTAL CLAVICLE RESECTION, POSSIBLE MINI OPEN ROTATOR CUFF REPAIR.  ;  Surgeon: Maude LELON Right, MD;  Location: [facility];  Service: Orthopedics;  Laterality: Right;  ARTHROSCOPIC DEBRIDEMENT SHOULDER, ACORMIOPLASTY, DISTAL CLAVICLE RESECTION, POSSIBLE MINI OPEN ROTATOR CUFF REPAIR.     Social History   Occupational History   Not on file  Tobacco Use   Smoking status: Never   Smokeless tobacco: Never  Vaping Use   Vaping status: Never Used  Substance and Sexual Activity   Alcohol use: Yes    Comment: occ   Drug use: No   Sexual activity: Not on file    Tura Roller Estela) Arlinda, M.D. [facility] 9:19 AM
